# Patient Record
Sex: Female | Born: 1985 | Hispanic: Yes | Marital: Married | Smoking: Never smoker
Health system: Southern US, Community
[De-identification: ages and names within clinical notes are randomized; demographics above are authoritative.]

## PROBLEM LIST (undated history)

## (undated) HISTORY — PX: TONSILLECTOMY: SUR1361

---

## 2016-07-09 NOTE — L&D Delivery Note (Signed)
Date of delivery: 03/08/17 Estimated Date of Delivery: 03/08/17 LMP 06/01/16 EGA: 1360w0d  Delivery Note At 3:05 PM a viable female "Mateo"  was delivered via Vaginal, Spontaneous Delivery (Presentation: cephalic, LOA).  APGAR: 7, 9; weight 7 lb 8.3 oz (3410 g).   Placenta status: spontaneous, intact.  Cord: 3vv, nuchal x1, tight. with the following complications: none apparent.  Cord pH: not collected  Anesthesia:  none Episiotomy: None Lacerations: None Suture Repair: n/a Est. Blood Loss (mL):  489cc (measured)  Mom presented to L&D with labor.  AROM'd for clear fluid. Progressed to complete, second stage: <10 mins.  delivery of fetal head with restitution to direct OA.    Nuchal noted; tight, not able to reduced at perineum.  Delivered through nuchal.  Anterior then posterior shoulders delivered without difficulty, then nuchal reduced.  Baby placed on mom's chest, and attended to by peds.  Cord was then clamped and cut by FOB.  Placenta spontaneously delivered, intact.   IV pitocin given for hemorrhage prophylaxis..  We sang happy birthday to baby [place].  Mom to postpartum.  Baby to Couplet care / Skin to Skin.  Chelsea C Ward 03/08/17

## 2016-09-10 LAB — OB RESULTS CONSOLE HEPATITIS B SURFACE ANTIGEN: HEP B S AG: NEGATIVE

## 2016-09-10 LAB — OB RESULTS CONSOLE GC/CHLAMYDIA
CHLAMYDIA, DNA PROBE: NEGATIVE
Gonorrhea: NEGATIVE

## 2016-09-10 LAB — OB RESULTS CONSOLE RPR: RPR: NONREACTIVE

## 2016-09-10 LAB — OB RESULTS CONSOLE HIV ANTIBODY (ROUTINE TESTING): HIV: NONREACTIVE

## 2016-09-10 LAB — OB RESULTS CONSOLE RUBELLA ANTIBODY, IGM: RUBELLA: IMMUNE

## 2016-09-12 LAB — OB RESULTS CONSOLE VARICELLA ZOSTER ANTIBODY, IGG: Varicella: IMMUNE

## 2016-10-18 ENCOUNTER — Other Ambulatory Visit: Payer: Self-pay | Admitting: Family Medicine

## 2016-10-18 DIAGNOSIS — Z3482 Encounter for supervision of other normal pregnancy, second trimester: Secondary | ICD-10-CM

## 2016-10-26 ENCOUNTER — Ambulatory Visit
Admission: RE | Admit: 2016-10-26 | Discharge: 2016-10-26 | Disposition: A | Discharge status: Home / Self Care | Payer: Self-pay | Source: Ambulatory Visit | Attending: Family Medicine | Admitting: Family Medicine

## 2016-10-26 DIAGNOSIS — Z3A21 21 weeks gestation of pregnancy: Secondary | ICD-10-CM | POA: Insufficient documentation

## 2016-10-26 DIAGNOSIS — Z3482 Encounter for supervision of other normal pregnancy, second trimester: Secondary | ICD-10-CM | POA: Insufficient documentation

## 2016-11-20 ENCOUNTER — Other Ambulatory Visit: Payer: Self-pay | Admitting: Family Medicine

## 2016-11-20 DIAGNOSIS — Z3480 Encounter for supervision of other normal pregnancy, unspecified trimester: Secondary | ICD-10-CM

## 2016-11-27 ENCOUNTER — Encounter: Payer: Self-pay | Admitting: Radiology

## 2016-11-27 ENCOUNTER — Ambulatory Visit
Admission: RE | Admit: 2016-11-27 | Discharge: 2016-11-27 | Disposition: A | Discharge status: Home / Self Care | Payer: Self-pay | Source: Ambulatory Visit | Attending: Family Medicine | Admitting: Family Medicine

## 2016-11-27 DIAGNOSIS — O283 Abnormal ultrasonic finding on antenatal screening of mother: Secondary | ICD-10-CM | POA: Insufficient documentation

## 2016-11-27 DIAGNOSIS — O321XX0 Maternal care for breech presentation, not applicable or unspecified: Secondary | ICD-10-CM | POA: Insufficient documentation

## 2016-11-27 DIAGNOSIS — Z3480 Encounter for supervision of other normal pregnancy, unspecified trimester: Secondary | ICD-10-CM

## 2016-11-27 DIAGNOSIS — Z3A25 25 weeks gestation of pregnancy: Secondary | ICD-10-CM | POA: Insufficient documentation

## 2016-12-14 LAB — OB RESULTS CONSOLE HIV ANTIBODY (ROUTINE TESTING): HIV: NONREACTIVE

## 2017-02-11 LAB — OB RESULTS CONSOLE GC/CHLAMYDIA
Chlamydia: NEGATIVE
Gonorrhea: NEGATIVE

## 2017-02-14 LAB — OB RESULTS CONSOLE GBS: STREP GROUP B AG: NEGATIVE

## 2017-03-08 ENCOUNTER — Inpatient Hospital Stay
Admission: EM | Admit: 2017-03-08 | Discharge: 2017-03-09 | DRG: 775 | Disposition: A | Discharge status: Home / Self Care | Payer: Medicaid Other | Attending: Obstetrics & Gynecology | Admitting: Obstetrics & Gynecology

## 2017-03-08 DIAGNOSIS — Z3A40 40 weeks gestation of pregnancy: Secondary | ICD-10-CM | POA: Diagnosis not present

## 2017-03-08 DIAGNOSIS — O99214 Obesity complicating childbirth: Secondary | ICD-10-CM | POA: Diagnosis present

## 2017-03-08 DIAGNOSIS — Z3493 Encounter for supervision of normal pregnancy, unspecified, third trimester: Secondary | ICD-10-CM | POA: Diagnosis present

## 2017-03-08 DIAGNOSIS — E669 Obesity, unspecified: Secondary | ICD-10-CM | POA: Diagnosis present

## 2017-03-08 LAB — CBC
HEMATOCRIT: 34.8 % — AB (ref 35.0–47.0)
Hemoglobin: 11.9 g/dL — ABNORMAL LOW (ref 12.0–16.0)
MCH: 30.7 pg (ref 26.0–34.0)
MCHC: 34.1 g/dL (ref 32.0–36.0)
MCV: 89.9 fL (ref 80.0–100.0)
Platelets: 215 10*3/uL (ref 150–440)
RBC: 3.87 MIL/uL (ref 3.80–5.20)
RDW: 13.9 % (ref 11.5–14.5)
WBC: 9.6 10*3/uL (ref 3.6–11.0)

## 2017-03-08 MED ORDER — OXYTOCIN 40 UNITS IN LACTATED RINGERS INFUSION - SIMPLE MED
INTRAVENOUS | Status: AC
  Filled 2017-03-08: qty 1000

## 2017-03-08 MED ORDER — PRENATAL MULTIVITAMIN CH
1.0000 | ORAL_TABLET | Freq: Every day | ORAL | Status: DC
  Administered 2017-03-09: 1 via ORAL
  Filled 2017-03-08: qty 1

## 2017-03-08 MED ORDER — LACTATED RINGERS IV SOLN: 500.0000 mL | INTRAVENOUS | Status: DC | PRN

## 2017-03-08 MED ORDER — ONDANSETRON HCL 4 MG PO TABS: 4.0000 mg | ORAL_TABLET | ORAL | Status: DC | PRN

## 2017-03-08 MED ORDER — LIDOCAINE HCL (PF) 1 % IJ SOLN
INTRAMUSCULAR | Status: AC
  Filled 2017-03-08: qty 30

## 2017-03-08 MED ORDER — AMMONIA AROMATIC IN INHA
RESPIRATORY_TRACT | Status: AC
  Filled 2017-03-08: qty 10

## 2017-03-08 MED ORDER — IBUPROFEN 600 MG PO TABS
600.0000 mg | ORAL_TABLET | Freq: Four times a day (QID) | ORAL | Status: DC
  Administered 2017-03-08 – 2017-03-09 (×3): 600 mg via ORAL
  Filled 2017-03-08 (×3): qty 1

## 2017-03-08 MED ORDER — WITCH HAZEL-GLYCERIN EX PADS: 1.0000 "application " | MEDICATED_PAD | CUTANEOUS | Status: DC

## 2017-03-08 MED ORDER — LACTATED RINGERS IV SOLN
INTRAVENOUS | Status: DC
  Administered 2017-03-08: 10:00:00 via INTRAVENOUS

## 2017-03-08 MED ORDER — OXYTOCIN BOLUS FROM INFUSION
500.0000 mL | Freq: Once | INTRAVENOUS | Status: AC
  Administered 2017-03-08: 500 mL via INTRAVENOUS

## 2017-03-08 MED ORDER — MISOPROSTOL 200 MCG PO TABS
ORAL_TABLET | ORAL | Status: AC
  Filled 2017-03-08: qty 4

## 2017-03-08 MED ORDER — ACETAMINOPHEN 500 MG PO TABS: 1000.0000 mg | ORAL_TABLET | Freq: Four times a day (QID) | ORAL | Status: DC | PRN

## 2017-03-08 MED ORDER — BENZOCAINE-MENTHOL 20-0.5 % EX AERO: 1.0000 "application " | INHALATION_SPRAY | CUTANEOUS | Status: DC | PRN

## 2017-03-08 MED ORDER — ONDANSETRON HCL 4 MG/2ML IJ SOLN: 4.0000 mg | INTRAMUSCULAR | Status: DC | PRN

## 2017-03-08 MED ORDER — DIPHENHYDRAMINE HCL 25 MG PO CAPS: 25.0000 mg | ORAL_CAPSULE | Freq: Four times a day (QID) | ORAL | Status: DC | PRN

## 2017-03-08 MED ORDER — LIDOCAINE HCL (PF) 1 % IJ SOLN: 30.0000 mL | INTRAMUSCULAR | Status: DC | PRN

## 2017-03-08 MED ORDER — SIMETHICONE 80 MG PO CHEW: 80.0000 mg | CHEWABLE_TABLET | ORAL | Status: DC | PRN

## 2017-03-08 MED ORDER — OXYTOCIN 40 UNITS IN LACTATED RINGERS INFUSION - SIMPLE MED: 2.5000 [IU]/h | INTRAVENOUS | Status: DC

## 2017-03-08 MED ORDER — OXYTOCIN 40 UNITS IN LACTATED RINGERS INFUSION - SIMPLE MED
1.0000 m[IU]/min | INTRAVENOUS | Status: DC
Start: 2017-03-08 — End: 2017-03-09

## 2017-03-08 MED ORDER — COCONUT OIL OIL: 1.0000 "application " | TOPICAL_OIL | Status: DC | PRN

## 2017-03-08 MED ORDER — DOCUSATE SODIUM 100 MG PO CAPS
100.0000 mg | ORAL_CAPSULE | Freq: Two times a day (BID) | ORAL | Status: DC
  Administered 2017-03-08 – 2017-03-09 (×2): 100 mg via ORAL
  Filled 2017-03-08 (×2): qty 1

## 2017-03-08 MED ORDER — DIBUCAINE 1 % RE OINT: 1.0000 "application " | TOPICAL_OINTMENT | RECTAL | Status: DC | PRN

## 2017-03-08 MED ORDER — OXYTOCIN 10 UNIT/ML IJ SOLN
INTRAMUSCULAR | Status: AC
  Filled 2017-03-08: qty 2

## 2017-03-08 NOTE — Progress Notes (Signed)
Pt oob in restroom, FHR not tracing well, d/t patient movement and reposition. Tracing maternal HR.

## 2017-03-08 NOTE — H&P (Signed)
  OB ADMISSION/ HISTORY & PHYSICAL:  Admission Date: 03/08/2017  1:45 AM  Admit Diagnosis: Active labor at term  Nancy Wall is a 31 y.o. female G2P1001 presenting for active labor, found to be 5cm on admission and contracting q5 min.  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 No pertinent past medical history.   Past surgical history: History reviewed. No pertinent surgical history.  Family History: History reviewed. No pertinent family history.   Social History:  reports that she has never smoked. She has never used smokeless tobacco. She reports that she does not drink alcohol or use drugs.   Allergies: Patient has no allergy information on record.    Current Medications at time of admission:  Prior to Admission medications   Medication Sig Start Date End Date Taking? Authorizing Provider  Prenatal Vit-Fe Fumarate-FA (PRENATAL MULTIVITAMIN) TABS tablet Take 1 tablet by mouth daily at 12 noon.   Yes [provider]     Review of Systems: Active FM onset of ctxlast night currently every 5 minutes   Physical Exam:  VS: Blood pressure 108/71, pulse 76, temperature 98.6 F (37 C), temperature source Oral, resp. rate 16.  General: alert and oriented, appears NAD Heart: RRR Lungs: Clear lung fields Abdomen: Gravid, soft and non-tender, non-distended / uterus: non tender Extremities: trace edema  Genitalia / VE: Dilation:  5 Effacement (%): 80 Station: -1 Exam by:: R. Fairley  FHR: baseline rate 140 / variability mod / accelerations + / no decelerations TOCO: q5 min  Last US 32wks normal anatomy  Assessment: 40+[redacted] weeks gestation 1 stage of labor FHR category 1   Plan:  Admit for active labor Labs pending Epidural when desired Continuous fetal monitoring   1. Fetal Well being  - Fetal Tracing: cat i - Ultrasound:  reviewed, as above - Group B Streptococcus: neg - Presentation: vtx confirmed by  sutures   2. Routine OB: - Prenatal labs reviewed, as above - Rh positive  4. Post Partum Planning: - Infant feeding: breast/bottle - Contraception: none

## 2017-03-08 NOTE — Progress Notes (Signed)
Assumed care   Admitted at 5cm, still 5cm but changing cervical position and effacement  S: uncomfortable with contractions O:  BP 108/65 (BP Location: Right Arm)   Pulse 72   Temp 98 F (36.7 C) (Oral)   Resp 16   SVE: 5-6/80/-1, anterior and soft TOCO: q233min FHT:  125mod + accels no decels  AROM'd for clear fluid   A/P: 31yo G2P1001 @ 40.0 with labor  1. Category 1 2. Expectant management after AROM.  If no change in 2hrs will start pitocin.  ----- Ranae Plumberhelsea Ward, MD Attending Obstetrician and Gynecologist [facility], Department of OB/GYN [facility]

## 2017-03-08 NOTE — Progress Notes (Signed)
[employer] interpretor services used for admission and consents.

## 2017-03-09 LAB — CBC
HCT: 30.6 % — ABNORMAL LOW (ref 35.0–47.0)
Hemoglobin: 10.3 g/dL — ABNORMAL LOW (ref 12.0–16.0)
MCH: 29.8 pg (ref 26.0–34.0)
MCHC: 33.5 g/dL (ref 32.0–36.0)
MCV: 88.9 fL (ref 80.0–100.0)
PLATELETS: 191 10*3/uL (ref 150–440)
RBC: 3.45 MIL/uL — AB (ref 3.80–5.20)
RDW: 14.2 % (ref 11.5–14.5)
WBC: 11.6 10*3/uL — AB (ref 3.6–11.0)

## 2017-03-09 LAB — RPR: RPR: NONREACTIVE

## 2017-03-09 LAB — TYPE AND SCREEN
ABO/RH(D): AB POS
ANTIBODY SCREEN: NEGATIVE

## 2017-03-09 NOTE — Progress Notes (Signed)
Dc home via w/c carrying baby.

## 2017-03-09 NOTE — Discharge Instructions (Signed)
Parto vaginal, cuidados posteriores  (Vaginal Delivery, Care After)  Siga estas instrucciones durante las próximas semanas. Estas indicaciones le proporcionan información acerca de cómo deberá cuidarse después del parto. El médico también podrá darle instrucciones más específicas. El tratamiento ha sido planificado según las prácticas médicas actuales, pero en algunos casos pueden ocurrir problemas. Llame al médico si tiene problemas o preguntas.  QUÉ ESPERAR DESPUÉS DEL PARTO  Después de un parto vaginal, es frecuente tener lo siguiente:  · Hemorragia leve de la vagina.  · Dolor en el abdomen, la vagina y la zona de la piel entre la abertura vaginal y el ano (perineo).  · Calambres pélvicos.  · Fatiga.  INSTRUCCIONES PARA EL CUIDADO EN EL HOGAR  Medicamentos  · Tome los medicamentos de venta libre y los recetados solamente como se lo haya indicado el médico.  · Si le recetaron un antibiótico, tómelo como se lo haya indicado el médico. No interrumpa la administración del antibiótico hasta que lo haya terminado.  Conducir  · No conduzca ni opere maquinaria pesada mientras toma analgésicos recetados.  · No conduzca durante 24 horas si le administraron un sedante.  Estilo de vida  · No beba alcohol. Esto es de suma importancia si está amamantando o toma analgésicos.  · No consuma productos que contengan tabaco, incluidos cigarrillos, tabaco de mascar o cigarrillos electrónicos. Si necesita ayuda para dejar de fumar, consulte al médico.  Comida y bebida  · Beba al menos 8 vasos de 8 onzas (240 cc) de agua todos los días a menos que el médico le indique lo contrario. Si elige amamantar al bebé, quizá deba beber aún más cantidad de agua.  · Ingiera alimentos ricos en fibras todos los días. Estos alimentos pueden ayudarla a prevenir o aliviar el estreñimiento. Los alimentos ricos en fibras incluyen, entre otros:  ? Panes y cereales integrales.  ? Arroz integral.  ? Frijoles.  ? Frutas y verduras  frescas.  Actividad  · Reanude sus actividades normales como se lo haya indicado el médico. Pregúntele al médico qué actividades son seguras para usted.  · Descanse todo lo que pueda. Trate de descansar o tomar una siesta mientras el bebé está durmiendo.  · No levante objetos que pesen más de 10 libras (4,5 kg) hasta que el médico le diga que es seguro hacerlo.  · Hable con el médico sobre cuándo puede volver a tener relaciones sexuales. Esto puede depender de lo siguiente:  ? Riesgo de sufrir infecciones.  ? Velocidad de cicatrización.  ? Comodidad y deseo de tener relaciones sexuales.  Cuidados vaginales  · Si le realizaron una episiotomía o tuvo un desgarro vaginal, contrólese la zona todos los días para detectar signos de infección. Esté atenta a los siguientes signos:  ? Aumento del enrojecimiento, la hinchazón o el dolor.  ? Más líquido o sangre.  ? Calor.  ? Pus o mal olor.  · No use tampones ni se haga duchas vaginales hasta que el médico la autorice.  · Controle la sangre que elimina por la vagina para detectar coágulos. Pueden tener el aspecto de grumos de color rojo oscuro, marrón o negro.  Instrucciones generales  · Mantenga el perineo limpio y seco, como se lo haya indicado el médico.  · Use ropa cómoda y suelta.  · Cuando vaya al baño, siempre higienícese de adelante hacia atrás.  · Pregúntele al médico si puede ducharse o tomar baños de inmersión. Si se le realizó una episiotomía o tuvo un desgarro perineal durante el trabajo del parto o   el parto, es posible que el médico le indique que no tome baños de inmersión durante un determinado tiempo.  · Use un sostén que sujete y ajuste bien sus pechos.  · Si es posible, pídale a alguien que la ayude con las tareas del hogar y a cuidar del bebé durante al menos algunos días después de salir del hospital.  · Concurra a todas las visitas de seguimiento para usted y el bebé, como se lo haya indicado el médico. Esto es importante.  SOLICITE ATENCIÓN MÉDICA  SI:  · Tiene los siguientes síntomas:  ? Secreción vaginal que tiene mal olor.  ? Dificultad para orinar.  ? Dolor al orinar.  ? Aumento o disminución repentinos de la frecuencia con que defeca.  ? Más enrojecimiento, hinchazón o dolor alrededor de la episiotomía o del desgarro vaginal.  ? Más secreción de líquido o sangre de la episiotomía o desgarro vaginal.  ? Pus o mal olor proveniente de la episiotomía o el desgarro vaginal.  ? Fiebre.  ? Erupción cutánea.  ? Poco interés o falta de interés en actividades que solían gustarle.  ? Dudas sobre su cuidado y el del bebé.  · Siente la episiotomía o el desgarro vaginal caliente al tacto.  · La episiotomía o el desgarro vaginal se está abriendo o no parece cicatrizar.  · Siente dolor en las mamas, o están duras o enrojecidas.  · Siente tristeza o preocupación de forma inusual.  · Siente náuseas o vomita.  · Elimina coágulos grandes por la vagina. Si expulsa un coágulo sanguíneo por la vagina, guárdelo para mostrárselo a su médico. No tire la cadena sin que el médico examine el coágulo antes.  · Orina más de lo habitual.  · Se siente mareada o se desmaya.  · No ha amamantado para nada y no ha tenido un período menstrual durante 12 semanas después del parto.  · Dejó de amamantar al bebé y no ha tenido su período menstrual durante 12 semanas después de dejar de amamantar.    SOLICITE ATENCIÓN MÉDICA DE INMEDIATO SI:  · Tiene los siguientes síntomas:  ? Dolor que no desaparece o no mejora con el medicamento.  ? Dolor en el pecho.  ? Dificultad para respirar.  ? Visión borrosa o manchas en la vista.  ? Pensamientos de autolesionarse o lesionar al bebé.  · Comienza a sentir dolor en el abdomen o en una de las piernas.  · Dolor de cabeza intenso.  · Se desmaya.  · Tiene una hemorragia tan intensa de la vagina que empapa dos toallitas sanitarias en una hora.    Esta información no tiene como fin reemplazar el consejo del médico. Asegúrese de hacerle al médico cualquier  pregunta que tenga.  Document Released: 06/25/2005 Document Revised: 10/17/2015 Document Reviewed: 07/10/2015  Elsevier Interactive Patient Education © 2017 [employer]

## 2017-03-09 NOTE — Discharge Summary (Signed)
Obstetrical Discharge Summary  Patient Name: Nancy Wall DOB: 1985/12/26 MRN: [id number]  Date of Admission: 03/08/2017 Date of Delivery: 03/08/17 Delivered by: Ranae Plumberhelsea Ward, MD Date of Discharge: 03/09/2017  [facility]  [facility] Estimated Date of Delivery: 03/08/17 Gestational Age at Delivery: 4762w0d   Antepartum complications: abnormal fetal ultrasound, obesity Admitting Diagnosis: labor Secondary Diagnosis: Patient Active Problem List   Diagnosis Date Noted  . Labor and delivery, indication for care 03/08/2017    Augmentation: AROM Complications: None Intrapartum complications/course:  Date of Delivery:  Delivered By: Ranae Plumberhelsea Ward Delivery Type: spontaneous vaginal delivery Anesthesia: epidural Placenta: sponatneous Laceration:  Episiotomy: none Newborn Data: Live born female  Birth Weight: 7 lb 8.3 oz (3410 g) APGAR: 7, 9  Postpartum Procedures: none  Post partum course:  Patient had an uncomplicated postpartum course.  By time of discharge on PPD#1, her pain was controlled on oral pain medications; she had appropriate lochia and was ambulating, voiding without difficulty and tolerating regular diet.  She was deemed stable for discharge to home.    Discharge Physical Exam:  BP 101/61 (BP Location: Left Arm)   Pulse 76   Temp 98.1 F (36.7 C) (Oral)   Resp 17   SpO2 100%   Breastfeeding? Unknown   General: NAD CV: RRR Pulm: CTABL, nl effort ABD: s/nd/nt, fundus firm and below the umbilicus Lochia: moderate DVT Evaluation: LE non-ttp, no evidence of DVT on exam.  Hemoglobin  Date Value Ref Range Status  03/09/2017 10.3 (L) 12.0 - 16.0 g/dL Final   HCT  Date Value Ref Range Status  03/09/2017 30.6 (L) 35.0 - 47.0 % Final     Disposition: stable, discharge to home. Baby Feeding: breastmilk Baby Disposition: home with mom  Rh Immune globulin given: n/a Rubella vaccine given: n/a Tdap vaccine given in AP or PP  setting: AP Flu vaccine given in AP or PP setting: n/a  Contraception: none  Prenatal Labs:   ABO, Rh: AB positive Antibody: Negative Rubella: Immune (03/05 0000)  RPR: Nonreactive (03/05 0000)  HBsAg: Negative (03/05 0000)  HIV: Non-reactive (06/08 0000)  GTT: wnl GBS: Negative (08/09 0000)   Plan:  Nancy Wall was discharged to home in good condition. Follow-up appointment with delivering provider in 6 weeks.  Discharge Medications: Allergies as of 03/09/2017   Not on File     Medication List    TAKE these medications   prenatal multivitamin Tabs tablet Take 1 tablet by mouth daily at 12 noon.            Discharge Care Instructions        Start     Ordered   03/09/17 0000  Call MD for:  temperature >100.4    Comments:  Call MD for temperature >101.0   03/09/17 1056   03/09/17 0000  Call MD for:  persistant nausea and vomiting     03/09/17 1056   03/09/17 0000  Call MD for:  severe uncontrolled pain     03/09/17 1056   03/09/17 0000  Call MD for:  redness, tenderness, or signs of infection (pain, swelling, redness, odor or green/yellow discharge around incision site)     03/09/17 1056   03/09/17 0000  Call MD for:  difficulty breathing, headache or visual disturbances     03/09/17 1056   03/09/17 0000  Call MD for:  persistant dizziness or light-headedness     03/09/17 1056   03/09/17 0000  Activity as tolerated  03/09/17 1056   03/09/17 0000  Sexual acrtivity    Comments:  Please refrain from intercourse until evaluated by your doctor/midwife at your 6 week visit.   03/09/17 1056   03/09/17 0000  Diet general     03/09/17 1056   03/08/17 0000  OB RESULTS CONSOLE GC/Chlamydia    Comments:  This external order was created through the Results Console.   03/08/17 0327   03/08/17 0000  OB RESULTS CONSOLE HIV antibody    Comments:  This external order was created through the Results Console.    03/08/17 0327   03/08/17 0000  OB RESULTS  CONSOLE GC/Chlamydia    Comments:  This external order was created through the Results Console.   03/08/17 0335   03/08/17 0000  OB RESULTS CONSOLE RPR    Comments:  This external order was created through the Results Console.    03/08/17 0335   03/08/17 0000  OB RESULTS CONSOLE HIV antibody    Comments:  This external order was created through the Results Console.    03/08/17 0335   03/08/17 0000  OB RESULTS CONSOLE Rubella Antibody    Comments:  This external order was created through the Results Console.    03/08/17 0335   03/08/17 0000  OB RESULTS CONSOLE Hepatitis B surface antigen    Comments:  This external order was created through the Results Console.    03/08/17 0335   03/08/17 0000  OB RESULT CONSOLE Group B Strep    Comments:  This external order was created through the Results Console.   03/08/17 0355   03/08/17 0000  OB RESULTS CONSOLE Varicella zoster antibody, IgG    Comments:  This external order was created through the Results Console.    03/08/17 0355      Follow-up Information    Ward, Elenora Fender, MD Follow up in 6 week(s).   Specialty:  Obstetrics and Gynecology Contact information: [address] [facility] [place] [address] [phone]           Signed: ----- Ranae Plumber, MD Attending Obstetrician and Gynecologist [facility], Department of OB/GYN [facility]

## 2017-03-09 NOTE — Progress Notes (Signed)
Dc instructions given using interpreter.  Going over birth certificate with interpreter

## 2017-03-12 ENCOUNTER — Telehealth: Payer: Self-pay

## 2017-03-12 ENCOUNTER — Other Ambulatory Visit: Payer: Self-pay | Admitting: Obstetrics and Gynecology

## 2017-03-12 MED ORDER — ACETAMINOPHEN 325 MG PO TABS: 650.0000 mg | ORAL_TABLET | Freq: Four times a day (QID) | ORAL | Status: AC | PRN

## 2017-03-12 MED ORDER — ONDANSETRON HCL 4 MG PO TABS: 4.0000 mg | ORAL_TABLET | Freq: Four times a day (QID) | ORAL | Status: AC | PRN

## 2017-03-12 MED ORDER — DEXTROSE IN LACTATED RINGERS 5 % IV SOLN: INTRAVENOUS | Status: AC

## 2017-03-12 MED ORDER — ACETAMINOPHEN 650 MG RE SUPP: 650.0000 mg | Freq: Four times a day (QID) | RECTAL | Status: AC | PRN

## 2017-03-12 MED ORDER — SODIUM CHLORIDE 0.9 % IV SOLN: 4.0000 mg | Freq: Four times a day (QID) | INTRAVENOUS | Status: AC | PRN

## 2017-03-12 MED ORDER — BUTORPHANOL TARTRATE 1 MG/ML IJ SOLN: 1.0000 mg | INTRAMUSCULAR | Status: AC | PRN

## 2018-08-20 IMAGING — US US OB COMP +14 WK
1 series · 13 of 28 positions shown · non-contrast
Comparison: none

CLINICAL DATA: Pregnancy.  Evaluate pregnancy and fetal anatomy .

EXAM:
OBSTETRICAL ULTRASOUND >14 WKS

[Series 1: us ob comp +14 wk · 0.28mm/px · 13 of 100 slices shown]
[im 4/100]
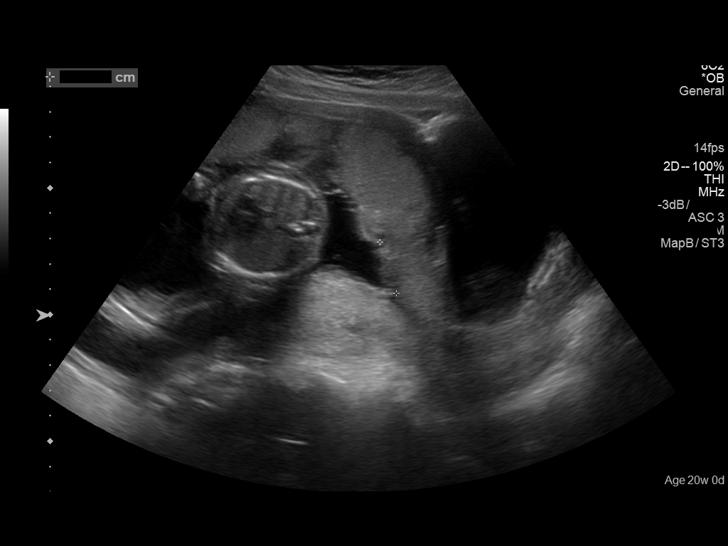
[im 12/100]
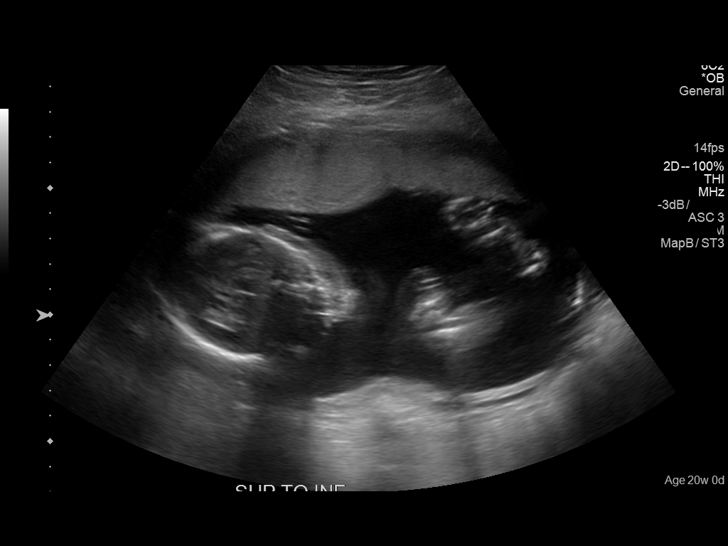
[im 19/100]
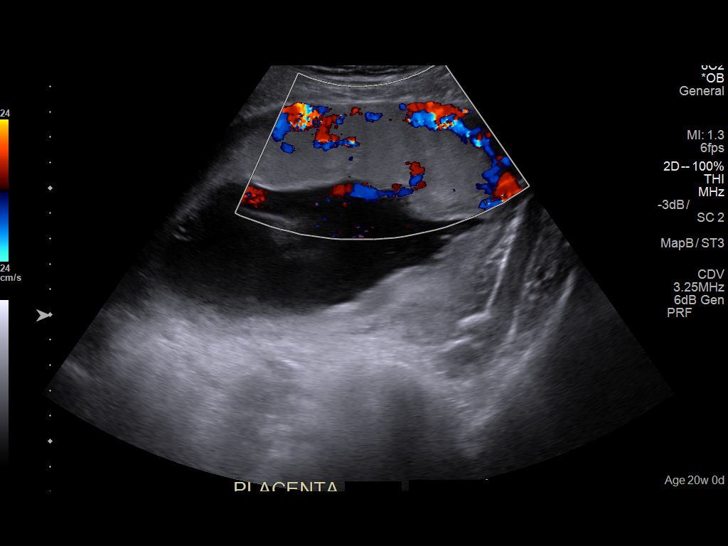
[im 26/100]
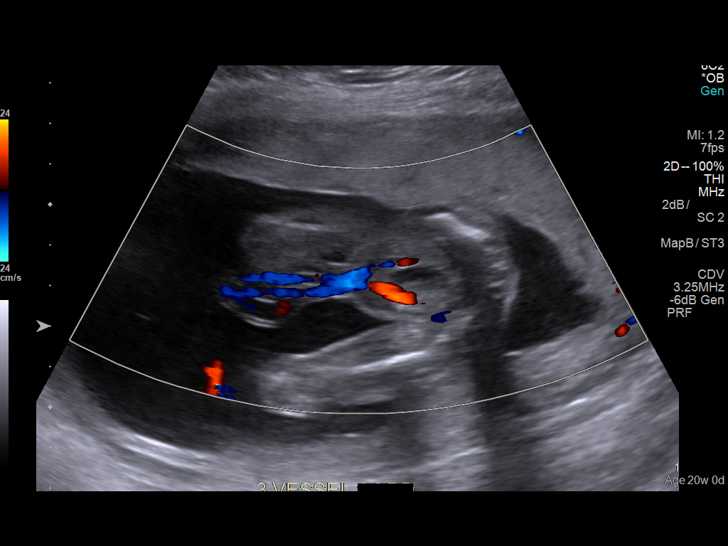
[im 34/100]
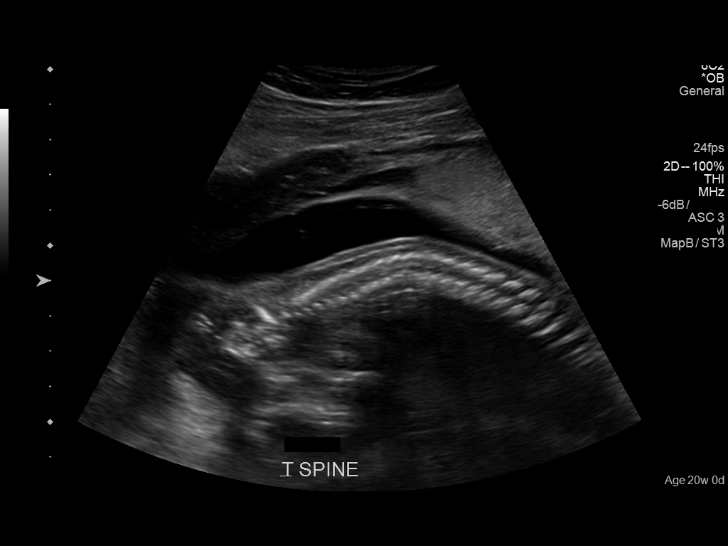
[im 41/100]
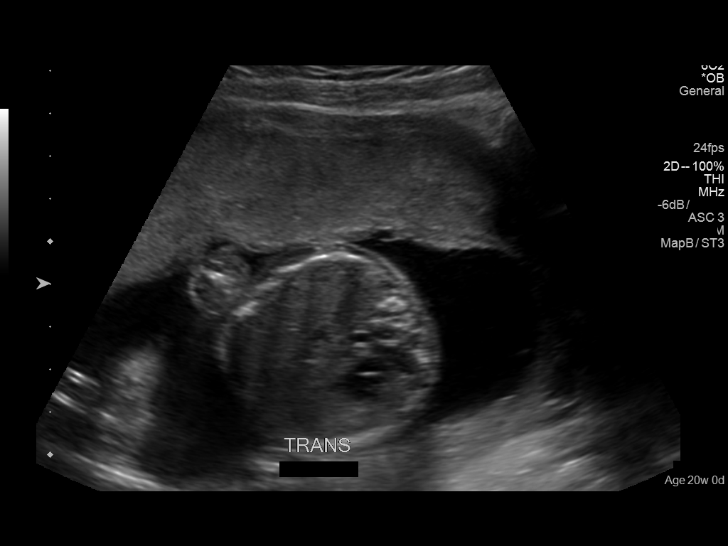
[im 52/100]
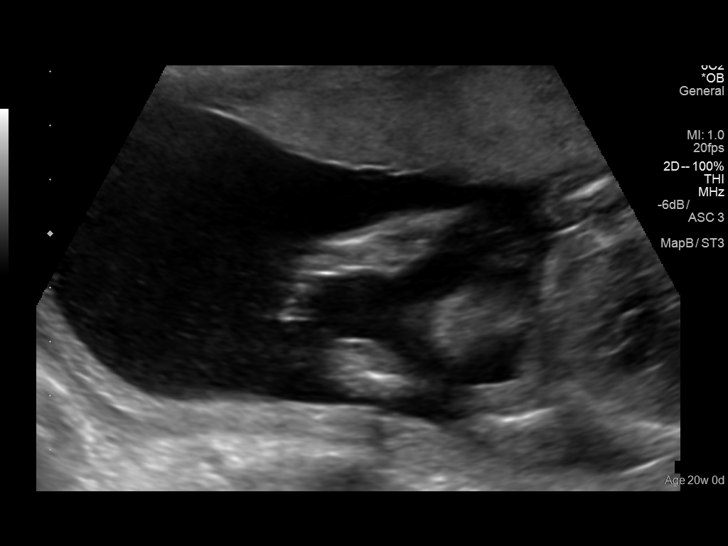
[im 59/100]
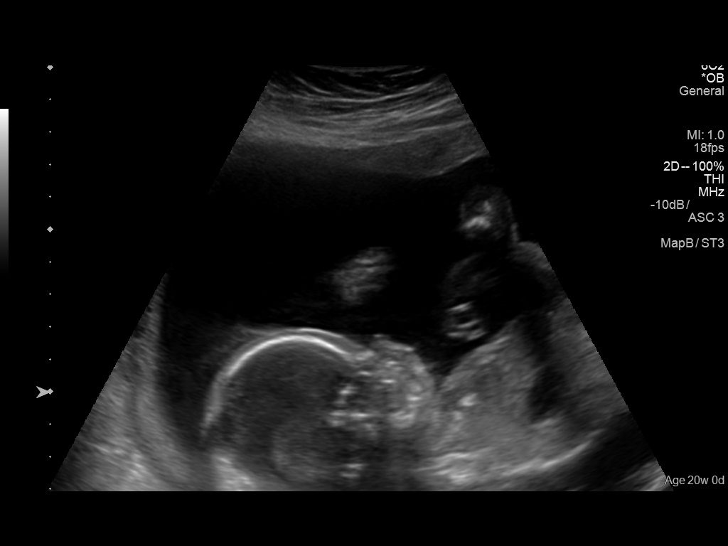
[im 67/100]
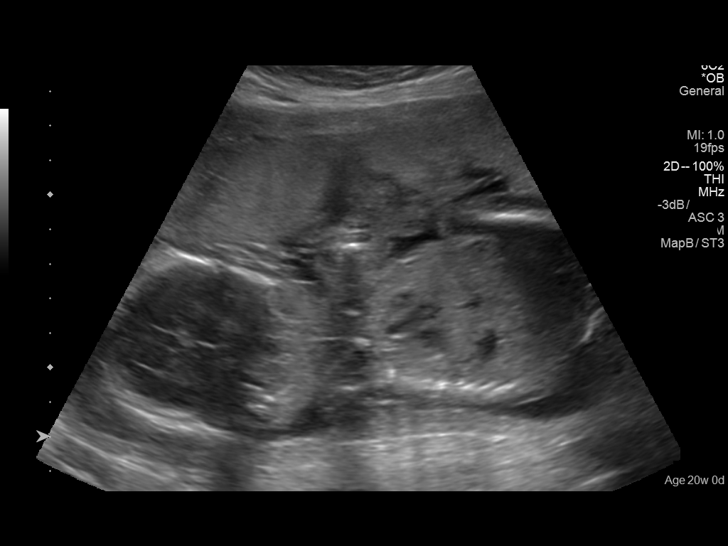
[im 74/100]
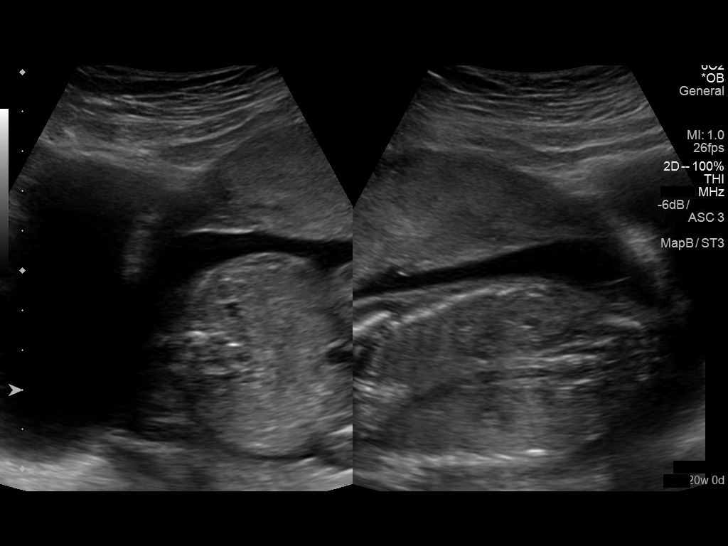
[im 81/100]
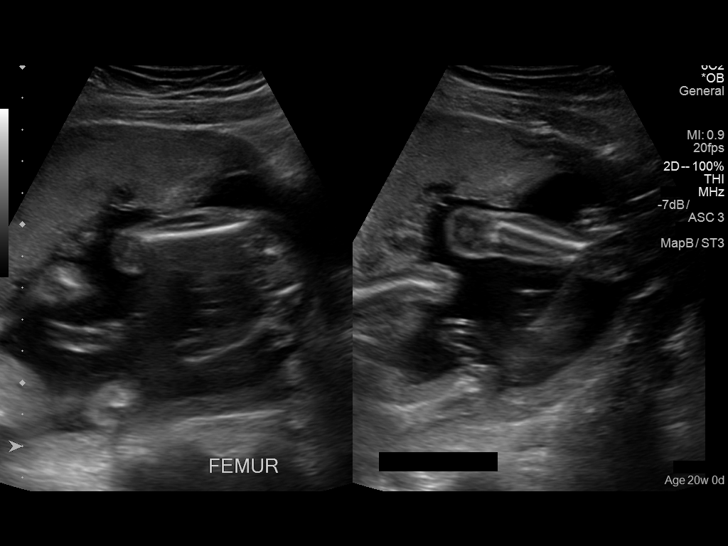
[im 89/100]
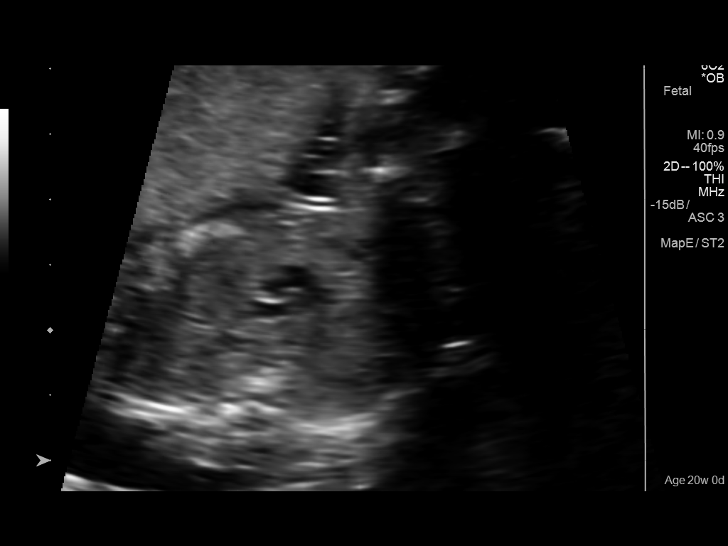
[im 96/100]
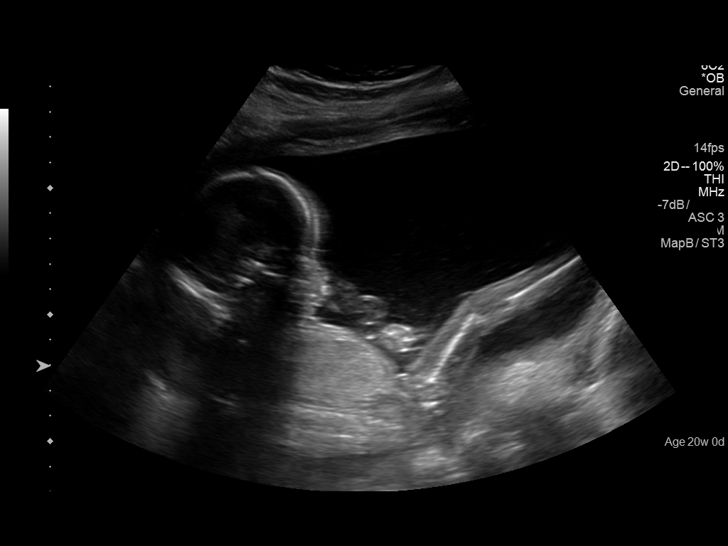

[13 of 28 positions shown; findings below may reference images not displayed]

FINDINGS: Number of Fetuses: 1

Heart Rate:  132 bpm

Movement: Present

Presentation: Transverse

Previa: No

Placental Location: Anterior

Amniotic Fluid (Subjective): Normal

Amniotic Fluid (Objective):

Vertical pocket 6.1cm

FETAL BIOMETRY

BPD:  4.9cm 20w 6d

HC:    18.4cm  20w   5d

AC:   16.1cm  21w   2d

FL:   3 pointcm  21w   6d

Current Mean GA: 21w 0d              US EDC: 03/08/2017

FETAL ANATOMY

Lateral Ventricles: Visualized

Thalami/CSP: Robert Kasia

Posterior Fossa:  Visual

Nuchal Region: Visualized

Upper Lip: Visualized

Spine: Visualized

4 Chamber Heart on Left: Visualized

LVOT: Visualized

RVOT: Visualized

Stomach on Left: Visualized

3 Vessel Cord: Visualized

Cord Insertion site: Visualized

Kidneys: Visualized

Bladder: Visualized

Extremities: Visualized

Maternal Findings:

Cervix:  5.2 cm and closed.
IMPRESSION: Single viable intrauterine pregnancy at 21 weeks 0 days. Fetal heart
rate 132 beats per minute. Clinical correlation suggested.

These results will be called to the ordering clinician or
representative by the Radiologist Assistant, and communication
documented in the PACS or zVision Dashboard.

## 2019-08-03 ENCOUNTER — Emergency Department: Payer: Self-pay

## 2019-08-03 ENCOUNTER — Emergency Department
Admission: EM | Admit: 2019-08-03 | Discharge: 2019-08-03 | Disposition: A | Discharge status: Home / Self Care | Payer: Self-pay | Attending: Emergency Medicine | Admitting: Emergency Medicine

## 2019-08-03 ENCOUNTER — Other Ambulatory Visit: Payer: Self-pay

## 2019-08-03 DIAGNOSIS — Z20822 Contact with and (suspected) exposure to covid-19: Secondary | ICD-10-CM | POA: Insufficient documentation

## 2019-08-03 DIAGNOSIS — J40 Bronchitis, not specified as acute or chronic: Secondary | ICD-10-CM | POA: Insufficient documentation

## 2019-08-03 DIAGNOSIS — Z8616 Personal history of COVID-19: Secondary | ICD-10-CM | POA: Insufficient documentation

## 2019-08-03 DIAGNOSIS — R0602 Shortness of breath: Secondary | ICD-10-CM | POA: Insufficient documentation

## 2019-08-03 LAB — URINALYSIS, COMPLETE (UACMP) WITH MICROSCOPIC
Bacteria, UA: NONE SEEN
Bilirubin Urine: NEGATIVE
Glucose, UA: NEGATIVE mg/dL
Hgb urine dipstick: NEGATIVE
Ketones, ur: NEGATIVE mg/dL
Nitrite: NEGATIVE
Protein, ur: NEGATIVE mg/dL
Specific Gravity, Urine: 1.014 (ref 1.005–1.030)
pH: 7 (ref 5.0–8.0)

## 2019-08-03 LAB — POCT PREGNANCY, URINE: Preg Test, Ur: NEGATIVE

## 2019-08-03 MED ORDER — PREDNISONE 20 MG PO TABS
60.0000 mg | ORAL_TABLET | Freq: Once | ORAL | Status: AC
  Administered 2019-08-03: 60 mg via ORAL
  Filled 2019-08-03: qty 3

## 2019-08-03 MED ORDER — AZITHROMYCIN 500 MG PO TABS
500.0000 mg | ORAL_TABLET | Freq: Once | ORAL | Status: AC
  Administered 2019-08-03: 23:00:00 500 mg via ORAL
  Filled 2019-08-03: qty 1

## 2019-08-03 MED ORDER — PREDNISONE 20 MG PO TABS: ORAL_TABLET | ORAL | 0 refills | Status: AC

## 2019-08-03 MED ORDER — AZITHROMYCIN 250 MG PO TABS: 250.0000 mg | ORAL_TABLET | Freq: Every day | ORAL | 0 refills | Status: AC

## 2019-08-03 NOTE — ED Provider Notes (Signed)
[facility] Emergency Department Provider Note ____________________________________________  Time seen: 2214  I have reviewed the triage vital signs and the nursing notes.  HISTORY  Chief Complaint  possible covid  History limited by [place] language.  Tele-interpreter used for interview and exam.  HPI Nancy Wall is a 34 y.o. female presents her self to the ED for evaluation of a sudden sensation of dysphagia and throat tightness.  Patient describes she was sitting on the ground playing with her young son, when she had the sudden sensation.  She denies any coughing, congestion, choking at the time.  Also denies any nausea, vomiting, chest pain, or syncope.  She continues to experience shortness of breath, describing that she feels like she cannot get a full breath of air.  She denies any recent fevers, chills, or sweats.  Patient denies any abdominal pain, reflux, or cough induced vomiting.  She does report being Covid positive back in May, but denies being symptomatic at the time.   History reviewed. No pertinent past medical history.  Patient Active Problem List   Diagnosis Date Noted  . Indication for care in labor and delivery, antepartum 03/12/2017  . Labor and delivery, indication for care 03/08/2017    History reviewed. No pertinent surgical history.  Prior to Admission medications   Medication Sig Start Date End Date Taking? Authorizing Provider  azithromycin (ZITHROMAX Z-PAK) 250 MG tablet Take 1 tablet (250 mg total) by mouth daily for 4 days. 08/04/19 08/08/19  Monaca Wadas, Dannielle Karvonen, PA-C  predniSONE (DELTASONE) 20 MG tablet Take 3 tabs daily x 1 day; Take 2 tabs daily x 2 days; Take 1 tab daily x 2 days; Take 0.5 tabs daily x 4 days 08/04/19   Roshawn Lacina, Dannielle Karvonen, PA-C  Prenatal Vit-Fe Fumarate-FA (PRENATAL MULTIVITAMIN) TABS tablet Take 1 tablet by mouth daily at 12 noon.    [provider]    Allergies Patient has no  allergy information on record.  History reviewed. No pertinent family history.  Social History Social History   Tobacco Use  . Smoking status: Never Smoker  . Smokeless tobacco: Never Used  Substance Use Topics  . Alcohol use: No  . Drug use: No    Review of Systems  Constitutional: Negative for fever. Reports generalized weakness.  Eyes: Negative for visual changes. ENT: Negative for sore throat. Reports dry mouth Cardiovascular: Negative for chest pain. Respiratory: Positive for shortness of breath. Gastrointestinal: Negative for abdominal pain, vomiting and diarrhea. Genitourinary: Negative for dysuria. Musculoskeletal: Negative for back pain. Skin: Negative for rash. Neurological: Negative for headaches, focal weakness or numbness. ____________________________________________  PHYSICAL EXAM:  VITAL SIGNS: ED Triage Vitals  Enc Vitals Group     BP 08/03/19 2128 116/72     Pulse Rate 08/03/19 2128 89     Resp 08/03/19 2128 18     Temp 08/03/19 2128 98.4 F (36.9 C)     Temp Source 08/03/19 2128 Oral     SpO2 08/03/19 2128 100 %     Weight 08/03/19 2131 130 lb (59 kg)     Height 08/03/19 2131 5\' 2"  (1.575 m)     Head Circumference --      Peak Flow --      Pain Score 08/03/19 2131 0     Pain Loc --      Pain Edu? --      Excl. in [place]? --     Constitutional: Alert and oriented. Well appearing and in  no distress. Head: Normocephalic and atraumatic. Eyes: Conjunctivae are normal. Normal extraocular movements Nose: No congestion/rhinorrhea/epistaxis. Mouth/Throat: Mucous membranes are moist. Uvula is midline and tonsils are flat. Post-nasal drainage noted.  Neck: Supple. No thyromegaly. Hematological/Lymphatic/Immunological: No cervical lymphadenopathy. Cardiovascular: Normal rate, regular rhythm. Normal distal pulses. Respiratory: Normal respiratory effort. No wheezes/rales/rhonchi. Gastrointestinal: Soft and nontender. No distention. Musculoskeletal:  Nontender with normal range of motion in all extremities.  Neurologic:  Normal gait without ataxia. Normal speech and language. No gross focal neurologic deficits are appreciated. Skin:  Skin is warm, dry and intact. No rash noted. Psychiatric: Mood and affect are normal. Patient exhibits appropriate insight and judgment. ____________________________________________   LABS (pertinent positives/negatives) Labs Reviewed  URINALYSIS, COMPLETE (UACMP) WITH MICROSCOPIC - Abnormal; Notable for the following components:      Result Value   Color, Urine STRAW (*)    APPearance CLEAR (*)    Leukocytes,Ua TRACE (*)    All other components within normal limits  SARS CORONAVIRUS 2 (TAT 6-24 HRS)  POC URINE PREG, ED  POCT PREGNANCY, URINE  ___________________________________________   RADIOLOGY  CXR IMPRESSION: 1. No focal pneumonia 2. Perihilar interstitial opacity and central airways thickening, possibly due to bronchitis or reactive airways.  I, Lissa Hoard, personally viewed and evaluated these images (plain radiographs) as part of my medical decision making, as well as reviewing the written report by the radiologist. ____________________________________________  PROCEDURES  Prednisone 60 mg PO Azithromycin 500 mg PO Procedures ____________________________________________  INITIAL IMPRESSION / ASSESSMENT AND PLAN / ED COURSE  Patient with ED evaluation of sudden onset of shortness of breath, generalized body aches and weakness.  Patient's exam is overall benign reassuring at this time.  Chest x-ray did show some moderate rhonchitic changes.  Patient will be treated empirically with a azithromycin and prednisone taper.  She is encouraged to follow-up with a primary provider for ongoing symptoms or return to the ED as necessary.  Covid test is pending, despite patient reporting positive test in May, at which time she was completely asymptomatic.  Nancy Wall was  evaluated in Emergency Department on 08/03/2019 for the symptoms described in the history of present illness. She was evaluated in the context of the global COVID-19 pandemic, which necessitated consideration that the patient might be at risk for infection with the SARS-CoV-2 virus that causes COVID-19. Institutional protocols and algorithms that pertain to the evaluation of patients at risk for COVID-19 are in a state of rapid change based on information released by regulatory bodies including the CDC and federal and state organizations. These policies and algorithms were followed during the patient's care in the ED. ____________________________________________  FINAL CLINICAL IMPRESSION(S) / ED DIAGNOSES  Final diagnoses:  Bronchitis      Ariza Evans, Charlesetta Ivory, PA-C 08/03/19 2319    Emily Filbert, MD 08/06/19 [phone]

## 2019-08-03 NOTE — ED Triage Notes (Addendum)
Via interpreter Pt to the er for dry mouth, body aches, SOB. Pt feels as if she cant get a good breath. Pt denies pain at this time. States she has difficult time swallowing. Pt denies allergies. Pt did eat dinner tonight. Pt is maintaining secretions.

## 2019-08-03 NOTE — Discharge Instructions (Addendum)
You are being treated for bronchitis. Take the prescription meds as directed. Follow-up with your provider for ongoing symptoms. You also have a COVID test pending. You will only be notified (via phone) if the results are positive.   Est recibiendo tratamiento para la bronquitis. Tome los medicamentos recetados segn las indicaciones. Haga un seguimiento con su proveedor para detectar sntomas continuos. Tambin tienes pendiente una prueba de COVID. Solo se le notificar (por telfono) si los resultados son positivos.

## 2019-08-04 LAB — SARS CORONAVIRUS 2 (TAT 6-24 HRS): SARS Coronavirus 2: NEGATIVE

## 2019-08-08 ENCOUNTER — Emergency Department: Payer: BC Managed Care – PPO

## 2019-08-08 ENCOUNTER — Encounter: Payer: Self-pay | Admitting: Emergency Medicine

## 2019-08-08 ENCOUNTER — Emergency Department
Admission: EM | Admit: 2019-08-08 | Discharge: 2019-08-08 | Disposition: A | Discharge status: Home / Self Care | Payer: BC Managed Care – PPO | Attending: Emergency Medicine | Admitting: Emergency Medicine

## 2019-08-08 ENCOUNTER — Other Ambulatory Visit: Payer: Self-pay

## 2019-08-08 DIAGNOSIS — R0789 Other chest pain: Secondary | ICD-10-CM | POA: Diagnosis not present

## 2019-08-08 DIAGNOSIS — R0602 Shortness of breath: Secondary | ICD-10-CM | POA: Insufficient documentation

## 2019-08-08 DIAGNOSIS — R002 Palpitations: Secondary | ICD-10-CM | POA: Insufficient documentation

## 2019-08-08 LAB — COMPREHENSIVE METABOLIC PANEL
ALT: 18 U/L (ref 0–44)
AST: 21 U/L (ref 15–41)
Albumin: 4.3 g/dL (ref 3.5–5.0)
Alkaline Phosphatase: 57 U/L (ref 38–126)
Anion gap: 9 (ref 5–15)
BUN: 12 mg/dL (ref 6–20)
CO2: 22 mmol/L (ref 22–32)
Calcium: 9 mg/dL (ref 8.9–10.3)
Chloride: 109 mmol/L (ref 98–111)
Creatinine, Ser: 1.05 mg/dL — ABNORMAL HIGH (ref 0.44–1.00)
GFR calc Af Amer: >60 mL/min (ref >60)
GFR calc non Af Amer: >60 mL/min (ref >60)
Glucose, Bld: 122 mg/dL — ABNORMAL HIGH (ref 70–99)
Potassium: 3.6 mmol/L (ref 3.5–5.1)
Sodium: 140 mmol/L (ref 135–145)
Total Bilirubin: 0.6 mg/dL (ref 0.3–1.2)
Total Protein: 8 g/dL (ref 6.5–8.1)

## 2019-08-08 LAB — CBC WITH DIFFERENTIAL/PLATELET
Abs Immature Granulocytes: 0.06 10*3/uL (ref 0.00–0.07)
Basophils Absolute: 0 10*3/uL (ref 0.0–0.1)
Basophils Relative: 0 %
Eosinophils Absolute: 0.2 10*3/uL (ref 0.0–0.5)
Eosinophils Relative: 2 %
HCT: 41.8 % (ref 36.0–46.0)
Hemoglobin: 13.8 g/dL (ref 12.0–15.0)
Immature Granulocytes: 1 %
Lymphocytes Relative: 28 %
Lymphs Abs: 2.6 10*3/uL (ref 0.7–4.0)
MCH: 28.7 pg (ref 26.0–34.0)
MCHC: 33 g/dL (ref 30.0–36.0)
MCV: 86.9 fL (ref 80.0–100.0)
Monocytes Absolute: 0.5 10*3/uL (ref 0.1–1.0)
Monocytes Relative: 6 %
Neutro Abs: 5.8 10*3/uL (ref 1.7–7.7)
Neutrophils Relative %: 63 %
Platelets: 291 10*3/uL (ref 150–400)
RBC: 4.81 MIL/uL (ref 3.87–5.11)
RDW: 12.3 % (ref 11.5–15.5)
WBC: 9.2 10*3/uL (ref 4.0–10.5)
nRBC: 0 % (ref 0.0–0.2)

## 2019-08-08 MED ORDER — HYDROXYZINE HCL 25 MG PO TABS: 25.0000 mg | ORAL_TABLET | Freq: Three times a day (TID) | ORAL | 0 refills | Status: AC | PRN

## 2019-08-08 NOTE — ED Provider Notes (Signed)
[facility] Emergency Department Provider Note  Time seen: 3:36 PM  I have reviewed the triage vital signs and the nursing notes. Spanish interpreter used for this evaluation.  HISTORY  Chief Complaint Shortness of Breath (Dx Bronchitis 08/03/19)   HPI Nancy Wall is a 34 y.o. female with no significant past medical history besides anxiety presents to the emergency department for shortness of breath.  According to the patient for the past several weeks she has been experiencing intermittent shortness of breath.  She describes the episodes lasting approximately an hour and can occur at any point during the day.  She states she feels like her heart is racing and feels like she might pass out.  Patient has been told in the past that these are likely due to anxiety but the patient was concerned so she came to the emergency department for evaluation.  States she is currently taking a medication for anxiety that she takes every day but she does not believe it is started helping yet.  Patient states a tightness sensation in her chest when this occurs but denies any tightness pain or pressure currently.  Denies any leg pain or swelling.  No history of DVT or PE.  No estrogen or hormonal supplements.   History reviewed. No pertinent past medical history.  Patient Active Problem List   Diagnosis Date Noted  . Indication for care in labor and delivery, antepartum 03/12/2017  . Labor and delivery, indication for care 03/08/2017    History reviewed. No pertinent surgical history.  Prior to Admission medications   Medication Sig Start Date End Date Taking? Authorizing Provider  azithromycin (ZITHROMAX Z-PAK) 250 MG tablet Take 1 tablet (250 mg total) by mouth daily for 4 days. 08/04/19 08/08/19  Menshew, Dannielle Karvonen, PA-C  predniSONE (DELTASONE) 20 MG tablet Take 3 tabs daily x 1 day; Take 2 tabs daily x 2 days; Take 1 tab daily x 2 days; Take 0.5 tabs daily x 4 days  08/04/19   Menshew, Dannielle Karvonen, PA-C  Prenatal Vit-Fe Fumarate-FA (PRENATAL MULTIVITAMIN) TABS tablet Take 1 tablet by mouth daily at 12 noon.    [provider]    No Known Allergies  No family history on file.  Social History Social History   Tobacco Use  . Smoking status: Never Smoker  . Smokeless tobacco: Never Used  Substance Use Topics  . Alcohol use: No  . Drug use: No    Review of Systems Constitutional: Negative for fever. Cardiovascular: Negative for chest pain. Respiratory: Intermittent shortness of breath.  None currently. Gastrointestinal: Negative for abdominal pain Genitourinary: Negative for urinary compaints Musculoskeletal: Negative for musculoskeletal complaints Skin: Negative for skin complaints  Neurological: Negative for headache All other ROS negative  ____________________________________________   PHYSICAL EXAM:  VITAL SIGNS: ED Triage Vitals  Enc Vitals Group     BP 08/08/19 1401 108/65     Pulse Rate 08/08/19 1401 75     Resp 08/08/19 1401 18     Temp 08/08/19 1401 98.3 F (36.8 C)     Temp Source 08/08/19 1401 Oral     SpO2 08/08/19 1401 97 %     Weight 08/08/19 1403 130 lb (59 kg)     Height 08/08/19 1403 5\' 2"  (1.575 m)     Head Circumference --      Peak Flow --      Pain Score 08/08/19 1403 0     Pain Loc --  Pain Edu? --      Excl. in [place]? --     Constitutional: Alert and oriented. Well appearing and in no distress. Eyes: Normal exam ENT      Head: Normocephalic and atraumatic.      Mouth/Throat: Mucous membranes are moist. Cardiovascular: Normal rate, regular rhythm. No murmur Respiratory: Normal respiratory effort without tachypnea nor retractions. Breath sounds are clear  Gastrointestinal: Soft and nontender. No distention.   Musculoskeletal: Nontender with normal range of motion in all extremities.  Neurologic:  Normal speech and language. No gross focal neurologic deficits  Skin:  Skin is warm, dry  and intact.  Psychiatric: Mood and affect are normal.   ____________________________________________    EKG  EKG viewed and interpreted by myself shows a normal sinus rhythm at 67 bpm with a narrow QRS, normal axis, normal intervals, no ST changes.  ____________________________________________    RADIOLOGY  Chest x-ray negative  ____________________________________________   INITIAL IMPRESSION / ASSESSMENT AND PLAN / ED COURSE  Pertinent labs & imaging results that were available during my care of the patient were reviewed by me and considered in my medical decision making (see chart for details).   Patient presents to the emergency department for intermittent shortness of breath.  Overall the patient appears extremely well, labs are reassuring, chest x-ray and EKG are reassuring.  Patient's vital signs are reassuring.  Patient is PERC negative.  Patient had a Covid test 5 days ago that was negative.  Patient's lung sounds are clear without any signs of wheeze rales or rhonchi.  Highly suspect anxiety could be playing a role, patient states these episodes often occur when she is lying down at night.  We will prescribe a as needed hydroxyzine for the patient.  Discussed this with the patient as well as precautions to avoid alcohol and driving while taking the medication.  Patient agreeable plan of care.  Nancy Wall was evaluated in Emergency Department on 08/08/2019 for the symptoms described in the history of present illness. She was evaluated in the context of the global COVID-19 pandemic, which necessitated consideration that the patient might be at risk for infection with the SARS-CoV-2 virus that causes COVID-19. Institutional protocols and algorithms that pertain to the evaluation of patients at risk for COVID-19 are in a state of rapid change based on information released by regulatory bodies including the CDC and federal and state organizations. These policies and  algorithms were followed during the patient's care in the ED.  ____________________________________________   FINAL CLINICAL IMPRESSION(S) / ED DIAGNOSES  Dyspnea   Minna Antis, MD 08/08/19 1540

## 2019-08-08 NOTE — ED Triage Notes (Signed)
Pt arrived via POV with family, reports she was seen on 1/25 dx with Bronchitis. Pt was prescribed z-pack which she states she finished yesterday, and prednisone, pt states she has not finished prednisone states she does not like how it makes her feel.  Pt continues to report shortness of breath and dry mouth and states she feels like she is going to pass out.

## 2021-03-12 IMAGING — DX DG CHEST 1V PORT
1 series · 1 of 1 positions shown · non-contrast
Comparison: None.

CLINICAL DATA: Cough and short of breath

EXAM:
PORTABLE CHEST 1 VIEW

[chest ap]
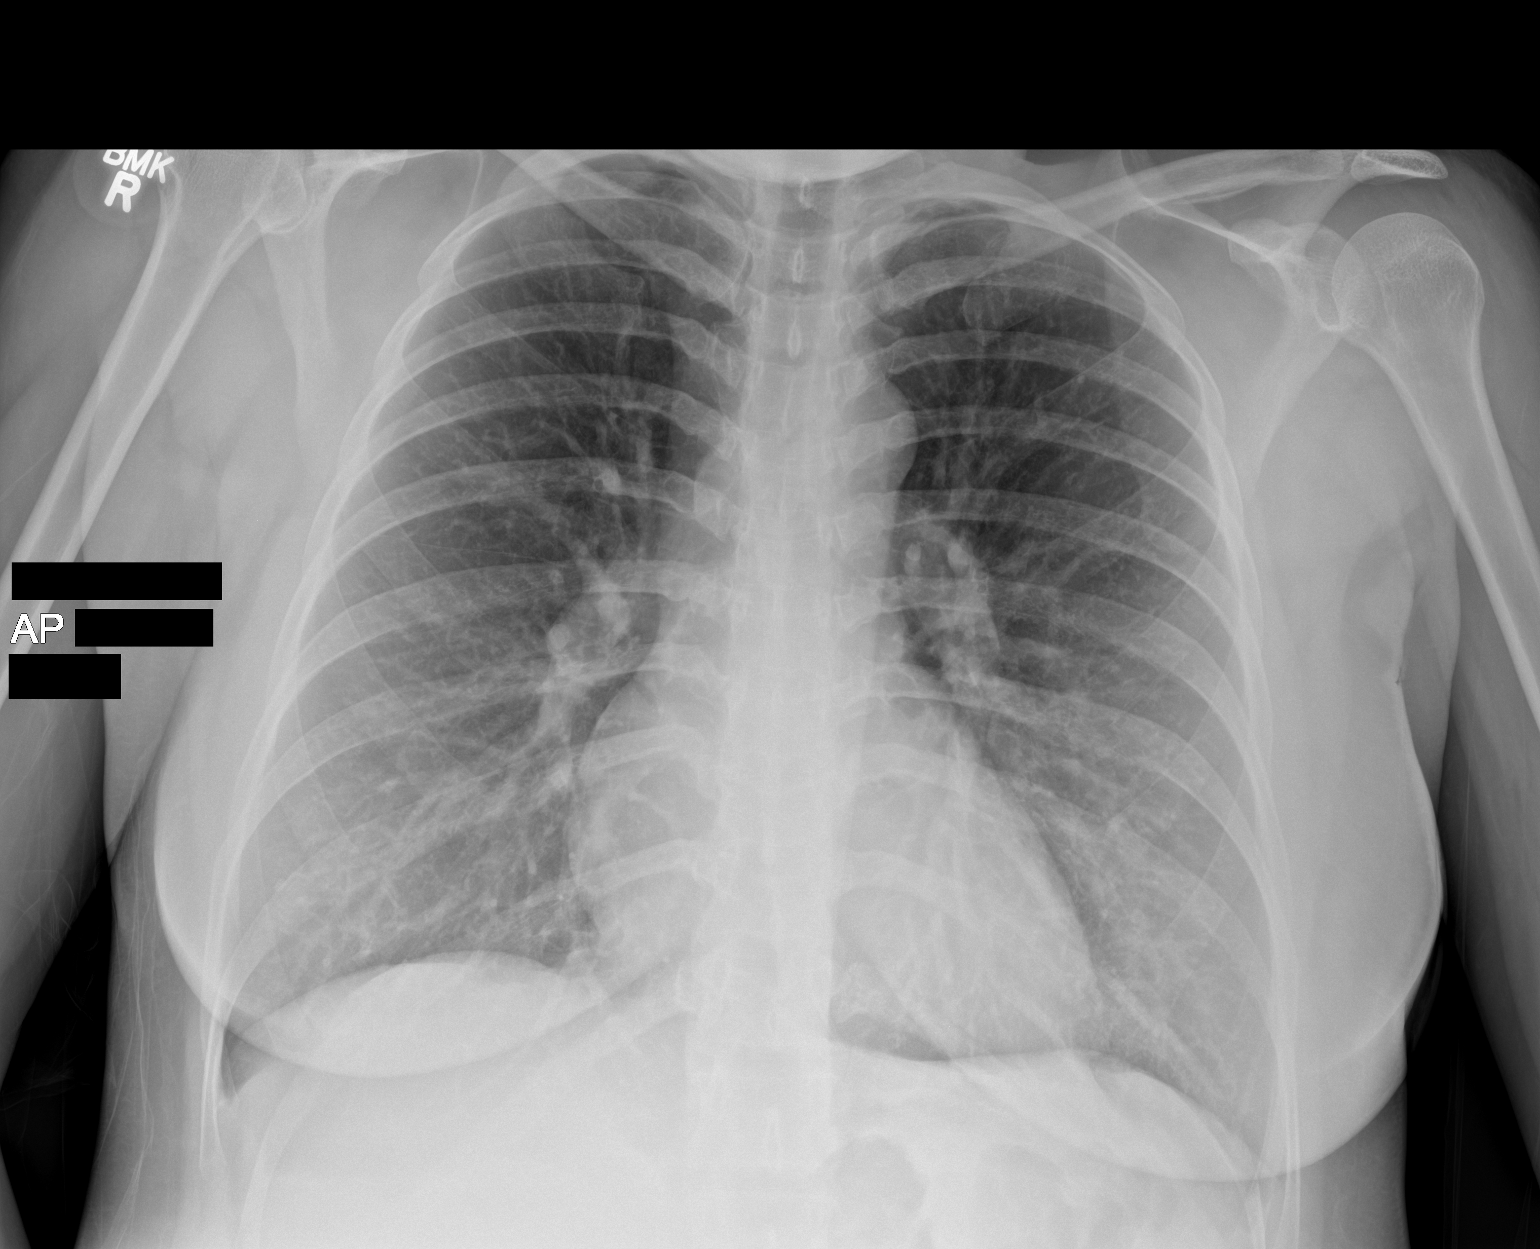

[1 of 1 positions shown; findings below may reference images not displayed]

FINDINGS: No consolidation or pleural effusion. Perihilar interstitial opacity
and central airways thickening. Normal heart size. No pneumothorax.
IMPRESSION: 1. No focal pneumonia
2. Perihilar interstitial opacity and central airways thickening,
possibly due to bronchitis or reactive airways.

## 2021-11-30 ENCOUNTER — Emergency Department
Admission: EM | Admit: 2021-11-30 | Discharge: 2021-11-30 | Disposition: A | Discharge status: Home / Self Care | Payer: Self-pay | Attending: Emergency Medicine | Admitting: Emergency Medicine

## 2021-11-30 ENCOUNTER — Emergency Department: Payer: Self-pay

## 2021-11-30 ENCOUNTER — Other Ambulatory Visit: Payer: Self-pay

## 2021-11-30 DIAGNOSIS — R1031 Right lower quadrant pain: Secondary | ICD-10-CM | POA: Insufficient documentation

## 2021-11-30 DIAGNOSIS — R102 Pelvic and perineal pain: Secondary | ICD-10-CM | POA: Insufficient documentation

## 2021-11-30 DIAGNOSIS — R1032 Left lower quadrant pain: Secondary | ICD-10-CM | POA: Insufficient documentation

## 2021-11-30 LAB — CBC
HCT: 38.6 % (ref 36.0–46.0)
Hemoglobin: 12.4 g/dL (ref 12.0–15.0)
MCH: 29.3 pg (ref 26.0–34.0)
MCHC: 32.1 g/dL (ref 30.0–36.0)
MCV: 91.3 fL (ref 80.0–100.0)
Platelets: 209 10*3/uL (ref 150–400)
RBC: 4.23 MIL/uL (ref 3.87–5.11)
RDW: 12.5 % (ref 11.5–15.5)
WBC: 8.3 10*3/uL (ref 4.0–10.5)
nRBC: 0 % (ref 0.0–0.2)

## 2021-11-30 LAB — URINALYSIS, ROUTINE W REFLEX MICROSCOPIC
Bilirubin Urine: NEGATIVE
Glucose, UA: NEGATIVE mg/dL
Hgb urine dipstick: NEGATIVE
Ketones, ur: NEGATIVE mg/dL
Leukocytes,Ua: NEGATIVE
Nitrite: NEGATIVE
Protein, ur: NEGATIVE mg/dL
Specific Gravity, Urine: 1.009 (ref 1.005–1.030)
pH: 8 (ref 5.0–8.0)

## 2021-11-30 LAB — COMPREHENSIVE METABOLIC PANEL
ALT: 13 U/L (ref 0–44)
AST: 21 U/L (ref 15–41)
Albumin: 4.2 g/dL (ref 3.5–5.0)
Alkaline Phosphatase: 52 U/L (ref 38–126)
Anion gap: 6 (ref 5–15)
BUN: 9 mg/dL (ref 6–20)
CO2: 27 mmol/L (ref 22–32)
Calcium: 8.7 mg/dL — ABNORMAL LOW (ref 8.9–10.3)
Chloride: 105 mmol/L (ref 98–111)
Creatinine, Ser: 0.8 mg/dL (ref 0.44–1.00)
GFR, Estimated: >60 mL/min (ref >60)
Glucose, Bld: 106 mg/dL — ABNORMAL HIGH (ref 70–99)
Potassium: 3.7 mmol/L (ref 3.5–5.1)
Sodium: 138 mmol/L (ref 135–145)
Total Bilirubin: 0.5 mg/dL (ref 0.3–1.2)
Total Protein: 7.2 g/dL (ref 6.5–8.1)

## 2021-11-30 LAB — WET PREP, GENITAL
Clue Cells Wet Prep HPF POC: NONE SEEN
Sperm: NONE SEEN
Trich, Wet Prep: NONE SEEN
WBC, Wet Prep HPF POC: <10 (ref <10)
Yeast Wet Prep HPF POC: NONE SEEN

## 2021-11-30 LAB — LIPASE, BLOOD: Lipase: 37 U/L (ref 11–51)

## 2021-11-30 LAB — CHLAMYDIA/NGC RT PCR (ARMC ONLY)
Chlamydia Tr: NOT DETECTED
N gonorrhoeae: NOT DETECTED

## 2021-11-30 LAB — POC URINE PREG, ED: Preg Test, Ur: NEGATIVE

## 2021-11-30 MED ORDER — IOHEXOL 300 MG/ML  SOLN
75.0000 mL | Freq: Once | INTRAMUSCULAR | Status: AC | PRN
  Administered 2021-11-30: 75 mL via INTRAVENOUS

## 2021-11-30 MED ORDER — IOHEXOL 300 MG/ML  SOLN: 100.0000 mL | Freq: Once | INTRAMUSCULAR | Status: DC | PRN

## 2021-11-30 NOTE — ED Notes (Signed)
Interpreter assisting this RN during d/c education.

## 2021-11-30 NOTE — ED Notes (Signed)
Pt ambulatory to treatment room. Nad noted. Interpreter requested

## 2021-11-30 NOTE — ED Notes (Signed)
Interpreter used. Pt reports RLQ pain that started today. Denies n/v/d

## 2021-11-30 NOTE — ED Triage Notes (Signed)
Pt to ED via [facility]. Pt reports epigastric and RLQ pain that started this morning. Pt sent over by [facility] to rule out appendicitis. Pt also reports increase urine frequency and burning. Pt denies N/V/D.   Virtual interpreter used for translation.

## 2021-11-30 NOTE — ED Notes (Signed)
Interpreter and CBT at bedside.

## 2021-11-30 NOTE — ED Provider Notes (Signed)
[facility] Provider Note    Event Date/Time   First MD Initiated Contact with Patient 11/30/21 1435     (approximate)   History   Abdominal Pain   HPI  Nancy Wall is a 36 y.o. female with no significant past medical history.  And as listed in EMR presents to the emergency department for treatment and evaluation of periumbilical to right lower quadrant pain.  Symptoms started this morning.  There is no associated fever, nausea, or vomiting.  She has had no previous abdominal surgeries..      Physical Exam   Triage Vital Signs: ED Triage Vitals  Enc Vitals Group     BP 11/30/21 1414 120/76     Pulse Rate 11/30/21 1414 (!) 55     Resp 11/30/21 1414 18     Temp 11/30/21 1414 98.6 F (37 C)     Temp Source 11/30/21 1414 Oral     SpO2 11/30/21 1415 100 %     Weight --      Height 11/30/21 1417 5\' 2"  (1.575 m)     Head Circumference --      Peak Flow --      Pain Score 11/30/21 1415 8     Pain Loc --      Pain Edu? --      Excl. in [place]? --     Most recent vital signs: Vitals:   11/30/21 1414 11/30/21 1415  BP: 120/76   Pulse: (!) 55   Resp: 18   Temp: 98.6 F (37 C)   SpO2:  100%    General: Awake, no distress.  CV:  Good peripheral perfusion.  Resp:  Normal effort.  Abd:  No distention.  Normal tenderness to palpation over the umbilicus, right lower quadrant and slightly tender in the left lower quadrant as well. Other: Pelvic exam shows thin, watery fluid in the vaginal vault.  Cervix is closed.  No active bleeding.    ED Results / Procedures / Treatments   Labs (all labs ordered are listed, but only abnormal results are displayed) Labs Reviewed  COMPREHENSIVE METABOLIC PANEL - Abnormal; Notable for the following components:      Result Value   Glucose, Bld 106 (*)    Calcium 8.7 (*)    All other components within normal limits  URINALYSIS, ROUTINE W REFLEX MICROSCOPIC - Abnormal; Notable for the following  components:   Color, Urine STRAW (*)    APPearance CLEAR (*)    All other components within normal limits  WET PREP, GENITAL  CHLAMYDIA/NGC RT PCR ([facility] ONLY)            LIPASE, BLOOD  CBC  POC URINE PREG, ED  POC URINE PREG, ED     EKG  Not indicated.   RADIOLOGY  Image interpreted and radiology report reviewed by me.  CT abdomen and pelvis negative for acute findings including appendicitis.  PROCEDURES:  Critical Care performed: No  Procedures   MEDICATIONS ORDERED IN ED: Medications  iohexol (OMNIPAQUE) 300 MG/ML solution 75 mL (75 mLs Intravenous Contrast Given 11/30/21 1515)     IMPRESSION / MDM / ASSESSMENT AND PLAN / ED COURSE   I have reviewed the triage note.  Differential diagnosis includes, but is not limited to: appendicitis, colitis, ovarian cysts, uti, sti, pelvic pain  36 year old female presenting to the emergency department for treatment and evaluation of pelvic and right lower quadrant pain.  See HPI for further details.  Interpreter, Verdis Frederickson etc., was utilized in Public house manager and discussing plan.  We will await the results of the labs and urinalysis and likely get a CT of the abdomen and pelvis to rule out appendicitis.  Patient denied questions and is agreeable to the plan.  CT is negative for findings including appendicitis.  Interpreter, Kennyth Lose, was utilized to discuss results with the patient.  I also recommended a pelvic exam to which the patient agreed.  Pelvic exam was performed and swabs were submitted to the lab. Will await the results of the wet prep as I do not have high suspicion of gonorrhea or chlamydia.  Patient denies abdominal pain at this time and states that it has resolved.  Wet prep is normal.  Patient will be discharged home to follow-up with her primary care provider for further work-up if symptoms return.    FINAL CLINICAL IMPRESSION(S) / ED DIAGNOSES   Final diagnoses:  Pelvic pain in female     Rx / DC Orders   ED  Discharge Orders     None        Note:  This document was prepared using Dragon voice recognition software and may include unintentional dictation errors.   Victorino Dike, FNP 11/30/21 1637    Blake Divine, MD 11/30/21 1659

## 2021-12-29 ENCOUNTER — Other Ambulatory Visit: Payer: Self-pay

## 2021-12-29 ENCOUNTER — Encounter: Payer: Self-pay | Admitting: Emergency Medicine

## 2021-12-29 ENCOUNTER — Emergency Department: Payer: 59

## 2021-12-29 ENCOUNTER — Emergency Department
Admission: EM | Admit: 2021-12-29 | Discharge: 2021-12-29 | Disposition: A | Discharge status: Home / Self Care | Payer: 59 | Attending: Student in an Organized Health Care Education/Training Program | Admitting: Student in an Organized Health Care Education/Training Program

## 2021-12-29 DIAGNOSIS — O368910 Maternal care for other specified fetal problems, first trimester, not applicable or unspecified: Secondary | ICD-10-CM | POA: Insufficient documentation

## 2021-12-29 DIAGNOSIS — Z3A01 Less than 8 weeks gestation of pregnancy: Secondary | ICD-10-CM | POA: Insufficient documentation

## 2021-12-29 DIAGNOSIS — Z6730 Type AB blood, Rh positive: Secondary | ICD-10-CM | POA: Diagnosis not present

## 2021-12-29 DIAGNOSIS — O209 Hemorrhage in early pregnancy, unspecified: Secondary | ICD-10-CM | POA: Diagnosis present

## 2021-12-29 DIAGNOSIS — O4690 Antepartum hemorrhage, unspecified, unspecified trimester: Secondary | ICD-10-CM

## 2021-12-29 LAB — CBC WITH DIFFERENTIAL/PLATELET
Abs Immature Granulocytes: 0.02 10*3/uL (ref 0.00–0.07)
Basophils Absolute: 0 10*3/uL (ref 0.0–0.1)
Basophils Relative: 0 %
Eosinophils Absolute: 0.1 10*3/uL (ref 0.0–0.5)
Eosinophils Relative: 1 %
HCT: 36.5 % (ref 36.0–46.0)
Hemoglobin: 11.9 g/dL — ABNORMAL LOW (ref 12.0–15.0)
Immature Granulocytes: 0 %
Lymphocytes Relative: 22 %
Lymphs Abs: 1.8 10*3/uL (ref 0.7–4.0)
MCH: 29.2 pg (ref 26.0–34.0)
MCHC: 32.6 g/dL (ref 30.0–36.0)
MCV: 89.5 fL (ref 80.0–100.0)
Monocytes Absolute: 0.6 10*3/uL (ref 0.1–1.0)
Monocytes Relative: 7 %
Neutro Abs: 5.8 10*3/uL (ref 1.7–7.7)
Neutrophils Relative %: 70 %
Platelets: 213 10*3/uL (ref 150–400)
RBC: 4.08 MIL/uL (ref 3.87–5.11)
RDW: 12.9 % (ref 11.5–15.5)
WBC: 8.3 10*3/uL (ref 4.0–10.5)
nRBC: 0 % (ref 0.0–0.2)

## 2021-12-29 LAB — BASIC METABOLIC PANEL
Anion gap: 4 — ABNORMAL LOW (ref 5–15)
BUN: 9 mg/dL (ref 6–20)
CO2: 24 mmol/L (ref 22–32)
Calcium: 8.9 mg/dL (ref 8.9–10.3)
Chloride: 108 mmol/L (ref 98–111)
Creatinine, Ser: 0.45 mg/dL (ref 0.44–1.00)
GFR, Estimated: >60 mL/min (ref >60)
Glucose, Bld: 106 mg/dL — ABNORMAL HIGH (ref 70–99)
Potassium: 3.7 mmol/L (ref 3.5–5.1)
Sodium: 136 mmol/L (ref 135–145)

## 2021-12-29 LAB — ABO/RH: ABO/RH(D): AB POS

## 2021-12-29 LAB — HCG, QUANTITATIVE, PREGNANCY: hCG, Beta Chain, Quant, S: 89797 m[IU]/mL — ABNORMAL HIGH (ref <5)

## 2021-12-29 LAB — POC URINE PREG, ED: Preg Test, Ur: POSITIVE — AB

## 2022-01-23 LAB — OB RESULTS CONSOLE HEPATITIS B SURFACE ANTIGEN: Hepatitis B Surface Ag: NEGATIVE

## 2022-01-23 LAB — OB RESULTS CONSOLE ABO/RH

## 2022-01-23 LAB — OB RESULTS CONSOLE GC/CHLAMYDIA
Chlamydia: NEGATIVE
Neisseria Gonorrhea: NEGATIVE

## 2022-01-23 LAB — OB RESULTS CONSOLE VARICELLA ZOSTER ANTIBODY, IGG: Varicella: IMMUNE

## 2022-01-23 LAB — OB RESULTS CONSOLE RUBELLA ANTIBODY, IGM: Rubella: IMMUNE

## 2022-02-22 ENCOUNTER — Other Ambulatory Visit: Payer: Self-pay | Admitting: Family Medicine

## 2022-02-22 DIAGNOSIS — Z3482 Encounter for supervision of other normal pregnancy, second trimester: Secondary | ICD-10-CM

## 2022-03-21 ENCOUNTER — Ambulatory Visit: Payer: 59

## 2022-04-09 ENCOUNTER — Ambulatory Visit
Admission: RE | Admit: 2022-04-09 | Discharge: 2022-04-09 | Disposition: A | Discharge status: Home / Self Care | Payer: 59 | Source: Ambulatory Visit | Attending: Family Medicine | Admitting: Family Medicine

## 2022-04-09 DIAGNOSIS — Z3A20 20 weeks gestation of pregnancy: Secondary | ICD-10-CM | POA: Diagnosis not present

## 2022-04-09 DIAGNOSIS — O321XX0 Maternal care for breech presentation, not applicable or unspecified: Secondary | ICD-10-CM | POA: Insufficient documentation

## 2022-04-09 DIAGNOSIS — Z3482 Encounter for supervision of other normal pregnancy, second trimester: Secondary | ICD-10-CM | POA: Insufficient documentation

## 2022-05-24 LAB — OB RESULTS CONSOLE HIV ANTIBODY (ROUTINE TESTING): HIV: NONREACTIVE

## 2022-05-24 LAB — OB RESULTS CONSOLE RPR: RPR: NONREACTIVE

## 2022-07-09 NOTE — L&D Delivery Note (Signed)
Delivery Note  Raeya Merritts is a [id number] at Unknown with an LMP of 11/16/21, consistent with [place] at [redacted]w[redacted]d.   First Stage: Labor onset: 0000 Analgesia /Anesthesia intrapartum: none SROM at 0000 GBS: negative IP Antibiotics: none  Second Stage: Complete dilation at 0129 Onset of pushing at 0129 FHR second stage 135 bpm with moderate, variable decels with pushing   Verdis Frederickson presented to L&D with SROM, contracting and in advanced labor. She was 8.5/100/-2. She progressed  to C/C/+2 with a spontaneous urge to push.  She pushed  effectively over approximately 1 minutes for a spontaneous vaginal birth.  Delivery of a viable baby boy on 08/16/22 at [place] by CNM Delivery of fetal head in OA position with restitution to ROT. no nuchal cord;  Anterior then posterior shoulders delivered easily with gentle downward traction. Baby placed on mom's chest, and attended to by baby RN Cord double clamped after cessation of pulsation, cut by FOB  Cord blood sample collection: Not Indicated AB POS Performed at [facility], [address] [place], [place] [address]  Collection of cord blood donation no Arterial cord blood sample no  Third Stage: Oxytocin bolus started after delivery of infant for hemorrhage prophylaxis  Placenta delivered Delena Bali intact with 3 VC @ 0135 Placenta disposition: discarded per protocol Uterine tone firm / bleeding small  no laceration identified  Anesthesia for repair: n/a Repair n/a Est. Blood Loss (mL): 409  Complications: none  Mom to postpartum.  Baby to Couplet care / Skin to Skin.  Newborn: Information for the patient's newborn:  Lyvia, Mondesir [[id number]]  Live born female  Birth Weight:   APGAR: 53, 9  Newborn Delivery   Birth date/time: 08/16/2020 01:29:00 Delivery type: Vaginal, Spontaneous       Feeding planned: breast feeding  ---------- Avelino Leeds, CNM Certified Nurse Midwife [facility]

## 2022-08-16 ENCOUNTER — Inpatient Hospital Stay
Admission: EM | Admit: 2022-08-16 | Discharge: 2022-08-17 | DRG: 807 | Disposition: A | Discharge status: Home / Self Care | Payer: 59

## 2022-08-16 ENCOUNTER — Other Ambulatory Visit: Payer: Self-pay

## 2022-08-16 DIAGNOSIS — K219 Gastro-esophageal reflux disease without esophagitis: Secondary | ICD-10-CM | POA: Diagnosis present

## 2022-08-16 DIAGNOSIS — O99344 Other mental disorders complicating childbirth: Secondary | ICD-10-CM | POA: Diagnosis present

## 2022-08-16 DIAGNOSIS — O9962 Diseases of the digestive system complicating childbirth: Principal | ICD-10-CM | POA: Diagnosis present

## 2022-08-16 DIAGNOSIS — F411 Generalized anxiety disorder: Secondary | ICD-10-CM | POA: Diagnosis present

## 2022-08-16 DIAGNOSIS — O9902 Anemia complicating childbirth: Secondary | ICD-10-CM | POA: Diagnosis present

## 2022-08-16 DIAGNOSIS — O479 False labor, unspecified: Principal | ICD-10-CM | POA: Diagnosis present

## 2022-08-16 DIAGNOSIS — D509 Iron deficiency anemia, unspecified: Secondary | ICD-10-CM | POA: Diagnosis present

## 2022-08-16 DIAGNOSIS — Z3A39 39 weeks gestation of pregnancy: Secondary | ICD-10-CM

## 2022-08-16 DIAGNOSIS — O26893 Other specified pregnancy related conditions, third trimester: Secondary | ICD-10-CM | POA: Diagnosis present

## 2022-08-16 LAB — CBC
HCT: 35.1 % — ABNORMAL LOW (ref 36.0–46.0)
Hemoglobin: 11.8 g/dL — ABNORMAL LOW (ref 12.0–15.0)
MCH: 31.1 pg (ref 26.0–34.0)
MCHC: 33.6 g/dL (ref 30.0–36.0)
MCV: 92.6 fL (ref 80.0–100.0)
Platelets: 204 10*3/uL (ref 150–400)
RBC: 3.79 MIL/uL — ABNORMAL LOW (ref 3.87–5.11)
RDW: 13.2 % (ref 11.5–15.5)
WBC: 10.8 10*3/uL — ABNORMAL HIGH (ref 4.0–10.5)
nRBC: 0 % (ref 0.0–0.2)

## 2022-08-16 LAB — TYPE AND SCREEN
ABO/RH(D): AB POS
Antibody Screen: NEGATIVE

## 2022-08-16 LAB — RPR: RPR Ser Ql: NONREACTIVE

## 2022-08-16 MED ORDER — SOD CITRATE-CITRIC ACID 500-334 MG/5ML PO SOLN: 30.0000 mL | ORAL | Status: DC | PRN

## 2022-08-16 MED ORDER — BISACODYL 10 MG RE SUPP: 10.0000 mg | Freq: Every day | RECTAL | Status: DC | PRN

## 2022-08-16 MED ORDER — OXYTOCIN 10 UNIT/ML IJ SOLN
INTRAMUSCULAR | Status: AC
  Filled 2022-08-16: qty 2

## 2022-08-16 MED ORDER — LACTATED RINGERS IV SOLN: INTRAVENOUS | Status: DC

## 2022-08-16 MED ORDER — FLEET ENEMA 7-19 GM/118ML RE ENEM: 1.0000 | ENEMA | Freq: Every day | RECTAL | Status: DC | PRN

## 2022-08-16 MED ORDER — SODIUM CHLORIDE 0.9% FLUSH: 3.0000 mL | INTRAVENOUS | Status: DC | PRN

## 2022-08-16 MED ORDER — ACETAMINOPHEN 325 MG PO TABS
650.0000 mg | ORAL_TABLET | ORAL | Status: DC | PRN
  Administered 2022-08-16: 650 mg via ORAL
  Filled 2022-08-16: qty 2

## 2022-08-16 MED ORDER — OXYTOCIN-SODIUM CHLORIDE 30-0.9 UT/500ML-% IV SOLN: 2.5000 [IU]/h | INTRAVENOUS | Status: DC

## 2022-08-16 MED ORDER — ONDANSETRON HCL 4 MG/2ML IJ SOLN: 4.0000 mg | Freq: Four times a day (QID) | INTRAMUSCULAR | Status: DC | PRN

## 2022-08-16 MED ORDER — COCONUT OIL OIL
1.0000 | TOPICAL_OIL | Status: DC | PRN
  Filled 2022-08-16: qty 7.5

## 2022-08-16 MED ORDER — TETANUS-DIPHTH-ACELL PERTUSSIS 5-2.5-18.5 LF-MCG/0.5 IM SUSY: 0.5000 mL | PREFILLED_SYRINGE | Freq: Once | INTRAMUSCULAR | Status: DC

## 2022-08-16 MED ORDER — IBUPROFEN 600 MG PO TABS
600.0000 mg | ORAL_TABLET | Freq: Four times a day (QID) | ORAL | Status: DC
  Administered 2022-08-16: 600 mg via ORAL
  Filled 2022-08-16 (×2): qty 1

## 2022-08-16 MED ORDER — MISOPROSTOL 200 MCG PO TABS
ORAL_TABLET | ORAL | Status: DC
Start: 2022-08-16 — End: 2022-08-16
  Filled 2022-08-16: qty 4

## 2022-08-16 MED ORDER — ZOLPIDEM TARTRATE 5 MG PO TABS: 5.0000 mg | ORAL_TABLET | Freq: Every evening | ORAL | Status: DC | PRN

## 2022-08-16 MED ORDER — ACETAMINOPHEN 325 MG PO TABS: 650.0000 mg | ORAL_TABLET | ORAL | Status: DC | PRN

## 2022-08-16 MED ORDER — ONDANSETRON HCL 4 MG/2ML IJ SOLN: 4.0000 mg | INTRAMUSCULAR | Status: DC | PRN

## 2022-08-16 MED ORDER — ONDANSETRON HCL 4 MG PO TABS: 4.0000 mg | ORAL_TABLET | ORAL | Status: DC | PRN

## 2022-08-16 MED ORDER — PRENATAL MULTIVITAMIN CH
1.0000 | ORAL_TABLET | Freq: Every day | ORAL | Status: DC
  Administered 2022-08-16: 1 via ORAL
  Filled 2022-08-16: qty 1

## 2022-08-16 MED ORDER — AMMONIA AROMATIC IN INHA
RESPIRATORY_TRACT | Status: AC
  Filled 2022-08-16: qty 10

## 2022-08-16 MED ORDER — LIDOCAINE HCL (PF) 1 % IJ SOLN
INTRAMUSCULAR | Status: DC
Start: 2022-08-16 — End: 2022-08-16
  Filled 2022-08-16: qty 30

## 2022-08-16 MED ORDER — SIMETHICONE 80 MG PO CHEW: 80.0000 mg | CHEWABLE_TABLET | ORAL | Status: DC | PRN

## 2022-08-16 MED ORDER — DIBUCAINE (PERIANAL) 1 % EX OINT: 1.0000 | TOPICAL_OINTMENT | CUTANEOUS | Status: DC | PRN

## 2022-08-16 MED ORDER — LIDOCAINE HCL (PF) 1 % IJ SOLN: 30.0000 mL | INTRAMUSCULAR | Status: DC | PRN

## 2022-08-16 MED ORDER — SODIUM CHLORIDE 0.9 % IV SOLN: 250.0000 mL | INTRAVENOUS | Status: DC | PRN

## 2022-08-16 MED ORDER — WITCH HAZEL-GLYCERIN EX PADS
1.0000 | MEDICATED_PAD | CUTANEOUS | Status: DC | PRN
  Filled 2022-08-16: qty 100

## 2022-08-16 MED ORDER — LACTATED RINGERS IV SOLN: 500.0000 mL | INTRAVENOUS | Status: DC | PRN

## 2022-08-16 MED ORDER — BENZOCAINE-MENTHOL 20-0.5 % EX AERO
1.0000 | INHALATION_SPRAY | CUTANEOUS | Status: DC | PRN
  Filled 2022-08-16: qty 56

## 2022-08-16 MED ORDER — SENNOSIDES-DOCUSATE SODIUM 8.6-50 MG PO TABS: 2.0000 | ORAL_TABLET | ORAL | Status: DC

## 2022-08-16 MED ORDER — SODIUM CHLORIDE 0.9% FLUSH: 3.0000 mL | Freq: Two times a day (BID) | INTRAVENOUS | Status: DC

## 2022-08-16 MED ORDER — DIPHENHYDRAMINE HCL 25 MG PO CAPS: 25.0000 mg | ORAL_CAPSULE | Freq: Four times a day (QID) | ORAL | Status: DC | PRN

## 2022-08-16 MED ORDER — OXYTOCIN BOLUS FROM INFUSION: 333.0000 mL | Freq: Once | INTRAVENOUS | Status: DC

## 2022-08-16 MED ORDER — OXYCODONE HCL 5 MG PO TABS: 5.0000 mg | ORAL_TABLET | ORAL | Status: DC | PRN

## 2022-08-16 MED ORDER — MEASLES, MUMPS & RUBELLA VAC IJ SOLR
0.5000 mL | Freq: Once | INTRAMUSCULAR | Status: DC
  Filled 2022-08-16: qty 0.5

## 2022-08-16 NOTE — H&P (Signed)
OB History & Physical   History of Present Illness:   Chief Complaint: SROM and uterine contractions  HPI:  Nancy Wall is a 37 y.o. G109P2002 female at Unknown, Patient's last menstrual period was 11/12/2021., consistent with [place] at [redacted]w[redacted]d, with Estimated Date of Delivery: None noted..  She presents to L&D for SROM and uterine contractions  Reports active fetal movement  Contractions: started at 0000 LOF/SROM: 0000 Vaginal bleeding: bloody show  Factors complicating pregnancy:  GAD IDA GERD Abnormal 1hr 3 hr gtt WNL  Patient Active Problem List   Diagnosis Date Noted   Uterine contractions 08/16/2022   Indication for care in labor and delivery, antepartum 03/12/2017   Labor and delivery, indication for care 03/08/2017    Prenatal Transfer Tool  Maternal Diabetes: No Genetic Screening: Declined Maternal Ultrasounds/Referrals: Normal Fetal Ultrasounds or other Referrals:  None Maternal Substance Abuse:  No Significant Maternal Medications:  None Significant Maternal Lab Results: None  Maternal Medical History:  History reviewed. No pertinent past medical history.  History reviewed. No pertinent surgical history.  No Known Allergies  Prior to Admission medications   Medication Sig Start Date End Date Taking? Authorizing Provider  hydrOXYzine (ATARAX/VISTARIL) 25 MG tablet Take 1 tablet (25 mg total) by mouth 3 (three) times daily as needed for anxiety. 08/08/19   Harvest Dark, MD  predniSONE (DELTASONE) 20 MG tablet Take 3 tabs daily x 1 day; Take 2 tabs daily x 2 days; Take 1 tab daily x 2 days; Take 0.5 tabs daily x 4 days 08/04/19   Menshew, Dannielle Karvonen, PA-C  Prenatal Vit-Fe Fumarate-FA (PRENATAL MULTIVITAMIN) TABS tablet Take 1 tablet by mouth daily at 12 noon.    [provider]     [facility]  OB History  Gravida Para Term Preterm AB Living  3 2 2  0 0 2  SAB IAB Ectopic Multiple Live Births  0 0 0 0 1     # Outcome Date GA Lbr Len/2nd Weight Sex Delivery Anes PTL Lv  3 Current           2 Term 03/08/17 [redacted]w[redacted]d / 00:15 3410 g M Vag-Spont        Name: Nancy Wall     Apgar1: 7  Apgar5: 9  1 Term 10/10/06    F Vag-Spont   LIV     Social History: She  reports that she has never smoked. She has never used smokeless tobacco. She reports that she does not drink alcohol and does not use drugs.  Family History: family history is not on file.   Review of Systems: A full review of systems was performed and negative except as noted in the HPI.     Physical Exam:  Vital Signs: LMP 11/12/2021   General: no acute distress.  HEENT: normocephalic, atraumatic Heart: regular rate & rhythm Lungs: normal respiratory effort Abdomen: soft, gravid, non-tender;   Pelvic:   External: Normal external female genitalia  Cervix:   /   /      Extremities: non-tender, symmetric, no edema bilaterally.    Neurologic: Alert & oriented x 3.    No results found for this or any previous visit (from the past 24 hour(s)).  Pertinent Results:  Prenatal Labs: Blood type/Rh AB POS Performed at [facility], [address] [place], [place] [address]    Antibody screen Negative    Rubella immune    Varicella Immune  RPR NR  HBsAg Neg   Hep C NR   HIV NR    GC neg  Chlamydia neg  Genetic screening declined  1 hour GTT 146  3 hour GTT 72,180,111,100  GBS unknown     FHT:  FHR: 135 bpm, variability: moderate,  accelerations:  Present,  decelerations:  Present variables with pushing Category/reactivity:  Category II UC:   regular, every 2-3 minutes   Cephalic by SVE   No results found.  Assessment:  Nancy Wall is a 37 y.o. G63P2002 female at 39.0 weeks with SROM, uterine contractions in advanced labor Plan:  1. Admit to Labor & Delivery - consents reviewed and obtained - Dr. Ouida Sills notified of admission and plan of care   2. Fetal Well being  - Fetal  Tracing: category 2 - Group B Streptococcus ppx not indicated: GBS unknown, term pregnancy - Presentation: cephalic confirmed by SVE   3. Routine OB: - Prenatal labs reviewed, as above - Rh positive - CBC, T&S, RPR on admit - Clear liquid diet ,  No IV access d/t precipitous labor and delivery  4. Monitoring of labor  - Contractions monitored with external toco - Pelvis proven to 9 lbs  - Plan for expectant management  - Plan for  continuous fetal monitoring - Maternal pain control as desired; planning unmedicated labor support options  - Anticipate vaginal delivery  5. Post Partum Planning: - Infant feeding: breast feeding - Contraception: natural family planning (NFP) - Tdap vaccine: Given prenatally - Flu vaccine:  declined  Nancy Wall, CNM 47/82/95 6:21 AM  Nancy Wall, CNM Certified Nurse Midwife [facility]

## 2022-08-16 NOTE — Lactation Note (Signed)
This note was copied from a baby's chart. Lactation Consultation Note  Patient Name: Nancy Wall [id number] Date: 08/16/2022   Age:37 hours  Care RN updated [facility]- Patient feels confident with breastfeeding (3rd baby), does not have any questions; declines visit at this time.   Lavonia Drafts 08/16/2022, 9:12 AM

## 2022-08-16 NOTE — Discharge Summary (Signed)
Obstetrical Discharge Summary  Patient Name: Nancy Wall DOB: 08-30-1985 MRN: [id number]  Date of Admission: 08/16/2022 Date of Delivery: 08/16/22 Delivered by: Avelino Leeds, CNM  Date of Discharge: 08/17/2022  Primary OB: [facility] [id number] last menstrual period was 11/12/2021. EDC Estimated Date of Delivery: None noted. Gestational Age at Delivery: Unknown   Antepartum complications:  GAD IDA GERD Abnormal 1hr 3 hr gtt WNL  Admitting Diagnosis: Uterine contractions [O47.9]  Secondary Diagnosis: Patient Active Problem List   Diagnosis Date Noted   Uterine contractions 08/16/2022   Indication for care in labor and delivery, antepartum 03/12/2017   Labor and delivery, indication for care 03/08/2017    Discharge Diagnosis: Term Pregnancy Delivered      Augmentation: N/A Complications: None Intrapartum complications/course: see delivery note Delivery Type: spontaneous vaginal delivery Anesthesia: non-pharmacological methods Placenta: spontaneous To Pathology: No  Laceration: n/a Episiotomy: none Newborn Data: Live born female  Birth Weight:  6lbs 11.2oz APGAR: 8, 9  Newborn Delivery   Birth date/time: 08/16/2022 01:29:00 Delivery type: Vaginal, Spontaneous      Postpartum Procedures: none Edinburgh:     08/16/2022   10:45 AM  Nancy Wall Postnatal Depression Scale Screening Tool  I have been able to laugh and see the funny side of things. 1  I have looked forward with enjoyment to things. 0  I have blamed myself unnecessarily when things went wrong. 0  I have been anxious or worried for no good reason. 0  I have felt scared or panicky for no good reason. 0  Things have been getting on top of me. 0  I have been so unhappy that I have had difficulty sleeping. 0  I have felt sad or miserable. 0  I have been so unhappy that I have been crying. 0  The thought of harming myself has occurred to me. 0  Edinburgh Postnatal Depression Scale Total 1      Post partum course:  Patient had an uncomplicated postpartum course.  By time of discharge on PPD#1, her pain was controlled on oral pain medications; she had appropriate lochia and was ambulating, voiding without difficulty and tolerating regular diet.  She was deemed stable for discharge to home.     Discharge Physical Exam:  BP (!) 88/64   Pulse 64   Temp 98.2 F (36.8 C) (Oral)   Resp 18   LMP 11/12/2021   SpO2 99%   Breastfeeding Unknown   General: NAD CV: RRR Pulm: CTABL, nl effort ABD: s/nd/nt, fundus firm and below the umbilicus Lochia: moderate Perineum: minimal edema/intact DVT Evaluation: LE non-ttp, no evidence of DVT on exam.  Hemoglobin  Date Value Ref Range Status  08/17/2022 10.9 (L) 12.0 - 15.0 g/dL Final   HCT  Date Value Ref Range Status  08/17/2022 32.5 (L) 36.0 - 46.0 % Final    Risk assessment for postpartum VTE and prophylactic treatment: Very high risk factors: None High risk factors: None Moderate risk factors: BMI 30-40 kg/m2  Postpartum VTE prophylaxis with LMWH not indicated  Disposition: stable, discharge to home. Baby Feeding: breast feeding Baby Disposition: home with mom  Rh Immune globulin indicated: No Rubella vaccine given: was not indicated Varivax vaccine given: was not indicated Flu vaccine given in AP setting: No Tdap vaccine given in AP setting: Yes   Contraception: natural family planning (NFP)  Prenatal Labs:   Blood type/Rh AB POS Performed at [facility], [address], [place], [place] [address]    Antibody screen Negative  Rubella immune    Varicella Immune  RPR NR    HBsAg Neg   Hep C NR   HIV NR    GC neg  Chlamydia neg  Genetic screening declined  1 hour GTT 146  3 hour GTT 72,180,111,100  GBS unknown       Plan:  Nancy Wall was discharged to home in good condition. Follow-up appointment with  provider in 2 weeks for a mood check and in 6 weeks for a postpartum  visit.  Discharge Medications: Allergies as of 08/17/2022   No Known Allergies      Medication List     TAKE these medications    acetaminophen 325 MG tablet Commonly known as: Tylenol Take 2 tablets (650 mg total) by mouth every 4 (four) hours as needed (for pain scale < 4).   hydrOXYzine 25 MG tablet Commonly known as: ATARAX Take 1 tablet (25 mg total) by mouth 3 (three) times daily as needed for anxiety.   ibuprofen 600 MG tablet Commonly known as: ADVIL Take 1 tablet (600 mg total) by mouth every 6 (six) hours as needed for mild pain or cramping.   predniSONE 20 MG tablet Commonly known as: DELTASONE Take 3 tabs daily x 1 day; Take 2 tabs daily x 2 days; Take 1 tab daily x 2 days; Take 0.5 tabs daily x 4 days   prenatal multivitamin Tabs tablet Take 1 tablet by mouth daily at 12 noon.         Follow-up [place], [place] Follow up in 2 week(s).   Specialty: General Practice Why: mood check Contact information: [place] [place] [address] [place], [place] Follow up in 6 week(s).   Specialty: General Practice Why: pp visit Contact information: [place]. [place] [address] [phone]                 Signed:  Drinda Butts, CNM Certified Nurse Midwife [facility]

## 2022-08-16 NOTE — Progress Notes (Signed)
Post Partum Day 0 Subjective: Doing well, no complaints.  Tolerating regular diet, pain with PO meds, voiding and ambulating without difficulty.  No CP SOB Fever,Chills, N/V or leg pain; denies nipple or breast pain no HA change of vision, RUQ/epigastric pain  Objective: BP 104/72 (BP Location: Left Arm)   Pulse 62   Temp 98.9 F (37.2 C) (Oral)   Resp 18   LMP 11/12/2021   SpO2 98%   Breastfeeding Unknown    Physical Exam:  General: NAD Breasts: soft/nontender CV: RRR Pulm: nl effort, CTABL Abdomen: soft, NT, BS x 4 Perineum: minimal edema, intact Lochia: small Uterine Fundus: fundus firm and 2 fb below umbilicus DVT Evaluation: no cords, ttp LEs   Recent Labs    08/16/22 0247  HGB 11.8*  HCT 35.1*  WBC 10.8*  PLT 204    Assessment/Plan: 37 y.o. G3P2003 postpartum day # 0  - Continue routine PP care - Lactation consult - Immunization status: all Imms up to date    Disposition: Does not desire Dc home today.     Francetta Found, CNM 08/16/2022  9:53 AM

## 2022-08-16 NOTE — Discharge Instructions (Signed)
Discharge Instructions:   Follow-up Appointment: Call and schedule your follow-up appointments for 2-weeks and 6-weeks at [facility]!   If there are any new medications, they have been ordered and will be available for pickup at the listed pharmacy on your way home from the hospital.   Call office if you have any of the following: headache, visual changes, fever >101.0 F, chills, shortness of breath, breast concerns, excessive vaginal bleeding, incision drainage or problems, leg pain or redness, depression or any other concerns. If you have vaginal discharge with an odor, let your doctor know.   It is normal to bleed for up to 6 weeks. You should not soak through more than 1 pad in 1 hour. If you have a blood clot larger than your fist with continued bleeding, call your doctor.   Activity: Do not lift > 10 lbs for 6 weeks (do not lift anything heavier than your baby). No intercourse, tampons, swimming pools, hot tubs, baths (only showers) for 6 weeks.  No driving for 1-2 weeks. Continue prenatal vitamin, especially if breastfeeding. Increase calories and fluids (water) while breastfeeding.   Your milk will come in, in the next couple of days (right now it is colostrum). You may have a slight fever when your milk comes in, but it should go away on its own.  If it does not, and rises above 101 F please call the doctor. You will also feel achy and your breasts will be firm. They will also start to leak. If you are breastfeeding, continue as you have been and you can pump/express milk for comfort.   If you have too much milk, your breasts can become engorged, which could lead to mastitis. This is an infection of the milk ducts. It can be very painful and you will need to notify your doctor to obtain a prescription for antibiotics. You can also treat it with a shower or hot/cold compress.   For concerns about your baby, please call your pediatrician.  For breastfeeding  concerns, the lactation consultant can be reached at [phone].   Postpartum blues (feelings of happy one minute and sad another minute) are normal for the first few weeks but if it gets worse let your doctor know.   Congratulations! We enjoyed caring for you and your new bundle of joy!

## 2022-08-17 ENCOUNTER — Encounter: Payer: Self-pay | Admitting: Emergency Medicine

## 2022-08-17 LAB — CBC
HCT: 32.5 % — ABNORMAL LOW (ref 36.0–46.0)
Hemoglobin: 10.9 g/dL — ABNORMAL LOW (ref 12.0–15.0)
MCH: 30.8 pg (ref 26.0–34.0)
MCHC: 33.5 g/dL (ref 30.0–36.0)
MCV: 91.8 fL (ref 80.0–100.0)
Platelets: 192 10*3/uL (ref 150–400)
RBC: 3.54 MIL/uL — ABNORMAL LOW (ref 3.87–5.11)
RDW: 13.2 % (ref 11.5–15.5)
WBC: 10.4 10*3/uL (ref 4.0–10.5)
nRBC: 0 % (ref 0.0–0.2)

## 2022-08-17 MED ORDER — ACETAMINOPHEN 325 MG PO TABS: 650.0000 mg | ORAL_TABLET | ORAL | Status: AC | PRN

## 2022-08-17 MED ORDER — IBUPROFEN 600 MG PO TABS: 600.0000 mg | ORAL_TABLET | Freq: Four times a day (QID) | ORAL | 0 refills | Status: AC | PRN

## 2022-08-17 NOTE — Progress Notes (Signed)
Postpartum Day  1 Interpreter used for communication   Subjective: no complaints, up ad lib, voiding, and tolerating PO.  Reports small clot with tissue expressed when she went to the bathroom this morning.   Doing well, no concerns. Ambulating without difficulty, pain managed with PO meds, tolerating regular diet, and voiding without difficulty.   No fever/chills, chest pain, shortness of breath, nausea/vomiting, or leg pain. No nipple or breast pain. No headache, visual changes, or RUQ/epigastric pain.  Objective: BP (!) 88/64   Pulse 64   Temp 98.2 F (36.8 C) (Oral)   Resp 18   LMP 11/12/2021   SpO2 99%   Breastfeeding Unknown    Physical Exam:  General: alert, cooperative, and no distress Breasts: soft/nontender CV: RRR Pulm: nl effort, CTABL Abdomen: soft, non-tender, active bowel sounds Uterine Fundus: firm Perineum: minimal edema, intact Lochia: appropriate DVT Evaluation: No evidence of DVT seen on physical exam.  Recent Labs    08/16/22 0247 08/17/22 0642  HGB 11.8* 10.9*  HCT 35.1* 32.5*  WBC 10.8* 10.4  PLT 204 192    Assessment/Plan: 37 y.o. G3P3003 postpartum day # 1  -Continue routine postpartum care -Lactation consult PRN for breastfeeding  -Acute blood loss anemia - hemodynamically stable and asymptomatic; start PO ferrous sulfate BID with stool softeners  -Immunization status:   all immunizations up to date -Small clot/tissue seen.  Possible small amount of membranes versus uterine lining.  [place] does not have any increased vaginal bleeding or pain.  Fundus is firm and at midline.  Will continue to monitor bleeding.  Reviewed warning signs to report to provider and nurses.    Disposition: Continue inpatient postpartum care.  GBS records requested from prenatal provider.  Possible DC later today if GBS is negative.    LOS: 1 day   Minda Meo, [place] 08/17/2022, 9:49 AM   ----- Drinda Butts  Certified Nurse Midwife [facility]

## 2022-08-17 NOTE — Progress Notes (Signed)
Patient discharged home with family.  Discharge instructions, when to follow up, and prescriptions reviewed with patient. Interpreter via video chat was used for translation. Patient verbalized understanding. Patient will be escorted out by auxiliary.

## 2022-08-20 ENCOUNTER — Other Ambulatory Visit: Payer: Self-pay

## 2023-06-18 ENCOUNTER — Other Ambulatory Visit: Payer: Self-pay | Admitting: Family Medicine

## 2023-06-18 DIAGNOSIS — N644 Mastodynia: Secondary | ICD-10-CM

## 2023-06-20 ENCOUNTER — Ambulatory Visit
Admission: RE | Admit: 2023-06-20 | Discharge: 2023-06-20 | Disposition: A | Discharge status: Home / Self Care | Payer: Medicaid Other | Source: Ambulatory Visit | Attending: Family Medicine | Admitting: Family Medicine

## 2023-06-20 DIAGNOSIS — N644 Mastodynia: Secondary | ICD-10-CM | POA: Insufficient documentation

## 2023-09-05 ENCOUNTER — Other Ambulatory Visit: Payer: Self-pay | Admitting: Family Medicine

## 2023-09-05 DIAGNOSIS — R1011 Right upper quadrant pain: Secondary | ICD-10-CM

## 2023-09-05 DIAGNOSIS — R1013 Epigastric pain: Secondary | ICD-10-CM
